# Patient Record
Sex: Female | Born: 1940
Health system: Southern US, Community
[De-identification: ages and names within clinical notes are randomized; demographics above are authoritative.]

## PROBLEM LIST (undated history)

## (undated) DIAGNOSIS — E785 Hyperlipidemia, unspecified: Secondary | ICD-10-CM

## (undated) DIAGNOSIS — I10 Essential (primary) hypertension: Secondary | ICD-10-CM

## (undated) DIAGNOSIS — E079 Disorder of thyroid, unspecified: Secondary | ICD-10-CM

## (undated) HISTORY — PX: PACEMAKER INSERTION: SHX728

## (undated) HISTORY — PX: ABDOMINAL HYSTERECTOMY: SHX81

## (undated) HISTORY — DX: Disorder of thyroid, unspecified: E07.9

## (undated) HISTORY — DX: Hyperlipidemia, unspecified: E78.5

## (undated) HISTORY — DX: Essential (primary) hypertension: I10

## (undated) HISTORY — PX: TUBAL LIGATION: SHX77

---

## 2005-06-16 ENCOUNTER — Ambulatory Visit: Payer: Self-pay | Admitting: Family Medicine

## 2005-07-19 ENCOUNTER — Ambulatory Visit: Payer: Self-pay | Admitting: Family Medicine

## 2005-11-16 ENCOUNTER — Ambulatory Visit: Payer: Self-pay | Admitting: Family Medicine

## 2006-02-17 ENCOUNTER — Ambulatory Visit: Payer: Self-pay | Admitting: Family Medicine

## 2006-04-14 ENCOUNTER — Ambulatory Visit: Payer: Self-pay | Admitting: Family Medicine

## 2006-04-19 DIAGNOSIS — E7800 Pure hypercholesterolemia, unspecified: Secondary | ICD-10-CM

## 2006-04-19 DIAGNOSIS — I10 Essential (primary) hypertension: Secondary | ICD-10-CM

## 2006-06-10 ENCOUNTER — Ambulatory Visit: Payer: Self-pay | Admitting: Family Medicine

## 2006-09-02 ENCOUNTER — Encounter: Payer: Self-pay | Admitting: Family Medicine

## 2006-09-02 ENCOUNTER — Ambulatory Visit: Payer: Self-pay | Admitting: Family Medicine

## 2006-09-03 ENCOUNTER — Encounter: Payer: Self-pay | Admitting: Family Medicine

## 2006-09-05 LAB — CONVERTED CEMR LAB
Trich, Wet Prep: NONE SEEN
Yeast Wet Prep HPF POC: NONE SEEN

## 2006-09-16 ENCOUNTER — Telehealth: Payer: Self-pay | Admitting: Family Medicine

## 2006-12-30 ENCOUNTER — Ambulatory Visit: Payer: Self-pay | Admitting: Family Medicine

## 2007-01-09 ENCOUNTER — Encounter: Payer: Self-pay | Admitting: Family Medicine

## 2007-01-10 ENCOUNTER — Encounter: Payer: Self-pay | Admitting: Family Medicine

## 2007-01-10 LAB — CONVERTED CEMR LAB
ALT: 24 units/L (ref 0–35)
AST: 20 units/L (ref 0–37)
Calcium: 9.3 mg/dL (ref 8.4–10.5)
Chloride: 105 meq/L (ref 96–112)
Cholesterol, target level: 200 mg/dL
Cholesterol: 209 mg/dL — ABNORMAL HIGH (ref 0–200)
HDL: 41 mg/dL (ref >39)
LDL Cholesterol: 126 mg/dL — ABNORMAL HIGH (ref 0–99)
Total Protein: 7 g/dL (ref 6.0–8.3)
Triglycerides: 209 mg/dL — ABNORMAL HIGH (ref <150)
VLDL: 42 mg/dL — ABNORMAL HIGH (ref 0–40)

## 2007-06-20 ENCOUNTER — Ambulatory Visit: Payer: Self-pay | Admitting: Family Medicine

## 2007-06-21 ENCOUNTER — Encounter: Payer: Self-pay | Admitting: Family Medicine

## 2007-10-17 ENCOUNTER — Encounter: Payer: Self-pay | Admitting: Family Medicine

## 2007-10-17 LAB — CONVERTED CEMR LAB
ALT: 18 units/L (ref 0–35)
AST: 19 units/L (ref 0–37)
Albumin: 4.5 g/dL (ref 3.5–5.2)
Calcium: 9.4 mg/dL (ref 8.4–10.5)
Cholesterol: 181 mg/dL (ref 0–200)
Total Bilirubin: 0.6 mg/dL (ref 0.3–1.2)
Total CHOL/HDL Ratio: 4.9
Total Protein: 7 g/dL (ref 6.0–8.3)

## 2007-10-18 ENCOUNTER — Ambulatory Visit: Payer: Self-pay | Admitting: Family Medicine

## 2007-10-18 DIAGNOSIS — E1165 Type 2 diabetes mellitus with hyperglycemia: Secondary | ICD-10-CM | POA: Insufficient documentation

## 2007-10-18 DIAGNOSIS — IMO0002 Reserved for concepts with insufficient information to code with codable children: Secondary | ICD-10-CM | POA: Insufficient documentation

## 2008-02-28 ENCOUNTER — Telehealth: Payer: Self-pay | Admitting: Family Medicine

## 2008-02-29 ENCOUNTER — Ambulatory Visit: Payer: Self-pay | Admitting: Family Medicine

## 2008-03-14 ENCOUNTER — Telehealth (INDEPENDENT_AMBULATORY_CARE_PROVIDER_SITE_OTHER): Payer: Self-pay | Admitting: *Deleted

## 2008-04-26 ENCOUNTER — Ambulatory Visit: Payer: Self-pay | Admitting: Family Medicine

## 2008-05-07 ENCOUNTER — Telehealth: Payer: Self-pay | Admitting: Family Medicine

## 2008-11-01 ENCOUNTER — Ambulatory Visit: Payer: Self-pay | Admitting: Family Medicine

## 2008-11-04 LAB — CONVERTED CEMR LAB
AST: 24 units/L (ref 0–37)
Alkaline Phosphatase: 67 units/L (ref 39–117)
BUN: 29 mg/dL — ABNORMAL HIGH (ref 6–23)
Chloride: 104 meq/L (ref 96–112)
Cholesterol: 196 mg/dL (ref 0–200)
Creatinine, Ser: 1.13 mg/dL (ref 0.40–1.20)
Glucose, Bld: 113 mg/dL — ABNORMAL HIGH (ref 70–99)
Potassium: 4.5 meq/L (ref 3.5–5.3)
TSH: 9.165 microintl units/mL — ABNORMAL HIGH (ref 0.350–4.500)
Total CHOL/HDL Ratio: 5.6
VLDL: 58 mg/dL — ABNORMAL HIGH (ref 0–40)

## 2008-11-05 ENCOUNTER — Encounter: Payer: Self-pay | Admitting: Family Medicine

## 2008-11-05 LAB — CONVERTED CEMR LAB
Free T4: 0.77 ng/dL — ABNORMAL LOW (ref 0.80–1.80)
T3, Free: 3.2 pg/mL (ref 2.3–4.2)

## 2008-11-07 ENCOUNTER — Telehealth (INDEPENDENT_AMBULATORY_CARE_PROVIDER_SITE_OTHER): Payer: Self-pay | Admitting: *Deleted

## 2008-11-14 ENCOUNTER — Telehealth: Payer: Self-pay | Admitting: Family Medicine

## 2009-01-06 ENCOUNTER — Telehealth: Payer: Self-pay | Admitting: Family Medicine

## 2009-01-08 ENCOUNTER — Encounter: Payer: Self-pay | Admitting: Family Medicine

## 2009-01-09 LAB — CONVERTED CEMR LAB: TSH: 4.327 microintl units/mL (ref 0.350–4.500)

## 2009-01-30 ENCOUNTER — Ambulatory Visit: Payer: Self-pay | Admitting: Family Medicine

## 2009-01-30 DIAGNOSIS — E039 Hypothyroidism, unspecified: Secondary | ICD-10-CM

## 2009-02-10 ENCOUNTER — Encounter: Payer: Self-pay | Admitting: Family Medicine

## 2009-02-11 LAB — CONVERTED CEMR LAB
ALT: 49 units/L — ABNORMAL HIGH (ref 0–35)
Albumin: 4.4 g/dL (ref 3.5–5.2)
Alkaline Phosphatase: 59 units/L (ref 39–117)
BUN: 13 mg/dL (ref 6–23)
Chloride: 106 meq/L (ref 96–112)
Cholesterol: 175 mg/dL (ref 0–200)
Glucose, Bld: 116 mg/dL — ABNORMAL HIGH (ref 70–99)
Sodium: 144 meq/L (ref 135–145)
T3, Free: 3.2 pg/mL (ref 2.3–4.2)
Total CHOL/HDL Ratio: 5.3
Triglycerides: 206 mg/dL — ABNORMAL HIGH (ref <150)
VLDL: 41 mg/dL — ABNORMAL HIGH (ref 0–40)

## 2009-04-14 ENCOUNTER — Telehealth: Payer: Self-pay | Admitting: Family Medicine

## 2009-04-15 ENCOUNTER — Encounter: Payer: Self-pay | Admitting: Family Medicine

## 2009-04-29 ENCOUNTER — Telehealth: Payer: Self-pay | Admitting: Family Medicine

## 2009-05-06 ENCOUNTER — Encounter: Admission: RE | Admit: 2009-05-06 | Discharge: 2009-05-06 | Payer: Self-pay | Admitting: Family Medicine

## 2009-08-05 ENCOUNTER — Telehealth: Payer: Self-pay | Admitting: Family Medicine

## 2010-01-06 ENCOUNTER — Ambulatory Visit: Payer: Self-pay | Admitting: Family

## 2010-01-07 ENCOUNTER — Encounter: Payer: Self-pay | Admitting: Family

## 2010-01-07 LAB — CONVERTED CEMR LAB
ALT: 40 units/L — ABNORMAL HIGH (ref 0–35)
AST: 35 units/L (ref 0–37)
Alkaline Phosphatase: 58 units/L (ref 39–117)
BUN: 18 mg/dL (ref 6–23)
CO2: 25 meq/L (ref 19–32)
Chloride: 101 meq/L (ref 96–112)
Creatinine, Ser: 1.08 mg/dL (ref 0.40–1.20)
Glucose, Bld: 96 mg/dL (ref 70–99)
Hgb A1c MFr Bld: 6.4 % — ABNORMAL HIGH (ref <5.7)
Sodium: 140 meq/L (ref 135–145)

## 2010-01-08 ENCOUNTER — Telehealth: Payer: Self-pay | Admitting: Family

## 2010-01-16 ENCOUNTER — Encounter: Payer: Self-pay | Admitting: Family

## 2010-01-16 LAB — CONVERTED CEMR LAB
LDL Cholesterol: 133 mg/dL — ABNORMAL HIGH (ref 0–99)
Triglycerides: 241 mg/dL — ABNORMAL HIGH (ref <150)
VLDL: 48 mg/dL — ABNORMAL HIGH (ref 0–40)

## 2010-01-22 ENCOUNTER — Telehealth: Payer: Self-pay | Admitting: Family

## 2010-01-22 ENCOUNTER — Encounter: Payer: Self-pay | Admitting: Family

## 2010-02-03 ENCOUNTER — Encounter: Payer: Self-pay | Admitting: Family Medicine

## 2010-03-27 ENCOUNTER — Ambulatory Visit: Payer: Self-pay | Admitting: Family Medicine

## 2010-03-30 LAB — CONVERTED CEMR LAB
Alkaline Phosphatase: 52 units/L (ref 39–117)
BUN: 19 mg/dL (ref 6–23)
CO2: 25 meq/L (ref 19–32)
Calcium: 9.1 mg/dL (ref 8.4–10.5)
Chloride: 106 meq/L (ref 96–112)
Creatinine, Ser: 1.14 mg/dL (ref 0.40–1.20)
Glucose, Bld: 114 mg/dL — ABNORMAL HIGH (ref 70–99)
HDL: 40 mg/dL (ref >39)
Potassium: 4.4 meq/L (ref 3.5–5.3)
TSH: 0.212 microintl units/mL — ABNORMAL LOW (ref 0.350–4.500)
Total Bilirubin: 0.6 mg/dL (ref 0.3–1.2)
Total Protein: 7.1 g/dL (ref 6.0–8.3)
Triglycerides: 235 mg/dL — ABNORMAL HIGH (ref <150)
VLDL: 47 mg/dL — ABNORMAL HIGH (ref 0–40)

## 2010-04-02 ENCOUNTER — Telehealth: Payer: Self-pay | Admitting: Family Medicine

## 2010-05-06 ENCOUNTER — Encounter: Payer: Self-pay | Admitting: Family Medicine

## 2010-05-06 LAB — CONVERTED CEMR LAB
OCCULT 1: NEGATIVE
OCCULT 3: NEGATIVE

## 2010-07-10 ENCOUNTER — Encounter
Admission: RE | Admit: 2010-07-10 | Discharge: 2010-07-10 | Payer: Self-pay | Source: Home / Self Care | Attending: Family Medicine | Admitting: Family Medicine

## 2010-07-10 ENCOUNTER — Ambulatory Visit
Admission: RE | Admit: 2010-07-10 | Discharge: 2010-07-10 | Payer: Self-pay | Source: Home / Self Care | Attending: Family Medicine | Admitting: Family Medicine

## 2010-07-10 DIAGNOSIS — M7700 Medial epicondylitis, unspecified elbow: Secondary | ICD-10-CM | POA: Insufficient documentation

## 2010-07-14 ENCOUNTER — Encounter
Admission: RE | Admit: 2010-07-14 | Discharge: 2010-07-14 | Payer: Self-pay | Source: Home / Self Care | Attending: Family Medicine | Admitting: Family Medicine

## 2010-08-04 ENCOUNTER — Encounter: Payer: Self-pay | Admitting: Family Medicine

## 2010-08-11 NOTE — Letter (Signed)
   [place] at [facility] [address] [place], [place]  [address]  [place] Phone: [phone]      January 22, 2010   [facility] [address] [place], [place] [address]  RE:  LAB RESULTS  Dear  Ms. Glander,  The following is an interpretation of your most recent lab tests.  Please take note of any instructions provided or changes to medications that have resulted from your lab work.  LIPID PANEL:  Fair - review at your next visit Triglyceride: 241   Cholesterol: 219   LDL: 133   HDL: 38   Chol/HDL%:  5.8 Ratio   Please continue pravastatin.  Our office will call you to arrange follow up blood work in 1 month.  Please arrange an appointment with Dr. Linford Arnold in 3 months.   Sincerely Yours,    Lemont Fillers FNP

## 2010-08-11 NOTE — Progress Notes (Signed)
Summary: CHolesterol med  Phone Note Call from Patient Call back at Home Phone [phone]   Caller: Patient Call For: Brandi Gasser MD Summary of Call: Pt calls back today and says since she can not afford the Lipitor is there anything else she can take that would be cheaper for her so she can do some price checking on them Initial call taken by: Kathlene November,  April 02, 2010 11:14 AM  Follow-up for Phone Call        Other option would be crestor which is branded but could do a higher dose adn split in half. Bottle will says once tab a day but really split in half.   Can send to pharm and have them price it out. If not affordable then can ask the pharmacist if any cheaper options on your insurance plan.  Follow-up by: Brandi Gasser MD,  April 02, 2010 12:44 PM  Additional Follow-up for Phone Call Additional follow up Details #1::        Pt notified of above instructions. kJ LPN Additional Follow-up by: Kathlene November,  April 02, 2010 2:19 PM    New/Updated Medications: CRESTOR 20 MG TABS (ROSUVASTATIN CALCIUM) Take 1 tablet by mouth once a day Prescriptions: CRESTOR 20 MG TABS (ROSUVASTATIN CALCIUM) Take 1 tablet by mouth once a day  #30 x 1   Entered and Authorized by:   Brandi Gasser MD   Signed by:   Brandi Gasser MD on 04/02/2010   Method used:   Electronically to        [facility] [phone]* (retail)       [address] [place], [place]  [address]       Ph: [phone]       Fax: [phone]   RxID:   [id number]

## 2010-08-11 NOTE — Progress Notes (Signed)
Summary: questions about meds- Lovaza too expensive  Phone Note Call from Patient   Caller: Patient Summary of Call: Dr.Metheney    Call Back [phone]---Dont leave a meaage bo she dont check voice mail   Patient wants a call back, questions about meds. Initial call taken by: Vanessa [place],  August 05, 2009 11:28 AM  Follow-up for Phone Call        Lovaza too expensive. Will take OTC fish oil 8 a day instead. Just an FYI Follow-up by: Kathlene November,  August 05, 2009 11:34 AM    New/Updated Medications: FISH OIL 1000 MG  CAPS (OMEGA-3 FATTY ACIDS) 8  tabs by mouth two times a day

## 2010-08-11 NOTE — Progress Notes (Signed)
  Phone Note Outgoing Call   Summary of Call: Please call patient and arrange a follow up lab draw in 1 month.  Cholesterol not yet at goal, but we will see how she does with the changed to pravastatin. I have entered orders.  Thanks  Follow-up for Phone Call        Pt notified of results and instructions. Follow-up by: Kathlene November,  January 22, 2010 12:26 PM

## 2010-08-11 NOTE — Progress Notes (Signed)
  Phone Note Outgoing Call   Call placed by: Lemont Fillers FNP,  January 08, 2010 9:02 AM Call placed to: Patient Summary of Call: Called patient, reviewed labs.  TSH is up.  Pt was only taking of levothyroxine for last several weeks-  instructed patient to resume and arrange f/u appointment with Dr. Linford Arnold in 6 weeks.  At that time she will need f/u TSH and LFT's (consider abdominal ultrasound at that time if ALT is still elevated). Initial call taken by: Lemont Fillers FNP,  January 08, 2010 9:05 AM

## 2010-08-11 NOTE — Assessment & Plan Note (Signed)
Summary: F/U chol,  thyroid   Vital Signs:  Patient profile:   70 year old female Height:      70 inches Weight:      230 pounds Pulse rate:   74 / minute BP sitting:   151 / 78  (left arm) Cuff size:   large  Vitals Entered By: Kathlene November (March 27, 2010 8:59 AM) Flu Vaccine Consent Questions     Do you have a history of severe allergic reactions to this vaccine? no    Any prior history of allergic reactions to egg and/or gelatin? no    Do you have a sensitivity to the preservative Thimersol? no    Do you have a past history of Guillan-Barre Syndrome? no    Do you currently have an acute febrile illness? no    Have you ever had a severe reaction to latex? no    Vaccine information given and explained to patient? yes    Are you currently pregnant? no    Lot Number:[id number]   Exp Date:01/09/2011   Site Given  Left Deltoid IM  Serial Vital Signs/Assessments:  Time      Position  BP       Pulse  Resp  Temp     By 9:25 AM             137/78                         Kathlene November  CC: needs cholesterol and TSH rechecked and refills, Hypertension Management   Primary Care Provider:  Nani Gasser MD  CC:  needs cholesterol and TSH rechecked and refills and Hypertension Management.  History of Present Illness: needs cholesterol and TSH rechecked and refills. Didn't  take BP Pills yet as she says she is fasting for labwork.    Hypertension History:      She denies headache, chest pain, palpitations, dyspnea with exertion, orthopnea, PND, peripheral edema, visual symptoms, neurologic problems, syncope, and side effects from treatment.  She notes no problems with any antihypertensive medication side effects.        Positive major cardiovascular risk factors include female age 25 years old or older, hyperlipidemia, and hypertension.  Negative major cardiovascular risk factors include no history of diabetes, negative family history for ischemic heart disease, and  non-tobacco-user status.        Further assessment for target organ damage reveals no history of ASHD, stroke/TIA, or peripheral vascular disease.     Current Medications (verified): 1)  Hydrochlorothiazide 25 Mg Tabs (Hydrochlorothiazide) .... Take 1 Tablet By Mouth Once A Day 2)  Metoprolol Tartrate 50 Mg Tabs (Metoprolol Tartrate) .Marland Kitchen.. 1 1/2 By Mouth Twice A Day 3)  Pravastatin Sodium 80 Mg Tabs (Pravastatin Sodium) .... One Tab By Mouth At Bedtime 4)  Fish Oil 1000 Mg  Caps (Omega-3 Fatty Acids) .... Take 3 Capsules By Mouth Twice A Day 5)  Vitamin C .... By Mouth Daily 6)  L Lysine .... By Mouth Daily 7)  Mv1 .... By Mouth Daily 8)  Calcium .... By Mouth Daily 9)  Asa 81mg  .... 1 By Mouth Daily 10)  Levothroid 75 Mcg Tabs (Levothyroxine Sodium) .... Take 1 Tablet By Mouth Once A Day  Allergies (verified): No Known Drug Allergies  Comments:  Nurse/Medical Assistant: The patient's medications and allergies were reviewed with the patient and were updated in the Medication and Allergy Lists. Kathlene November (March 27, 2010 9:00  AM)  Physical Exam  General:  Well-developed,well-nourished,in no acute distress; alert,appropriate and cooperative throughout examination Head:  Normocephalic and atraumatic without obvious abnormalities. No apparent alopecia or balding. Eyes:  No corneal or conjunctival inflammation noted. EOMI. Perrla. Neck:  No deformities, masses, or tenderness noted. NO TM.  Lungs:  Normal respiratory effort, chest expands symmetrically. Lungs are clear to auscultation, no crackles or wheezes. Heart:  Normal rate and regular rhythm. S1 and S2 normal without gallop, murmur, click, rub or other extra sounds. No carotid bruits.  Skin:  no rashes.   Cervical Nodes:  No lymphadenopathy noted Psych:  Cognition and judgment appear intact. Alert and cooperative with normal attention span and concentration. No apparent delusions, illusions, hallucinations   Impression &  Recommendations:  Problem # 1:  HYPERCHOLESTEROLEMIA (ICD-272.0) Recheck since changed to pravastatin. If not at goal change to Lipitor when goes generic.  Given flu and pneumoniia vaccines and stool cards today.  Her updated medication list for this problem includes:    Pravastatin Sodium 80 Mg Tabs (Pravastatin sodium) ..... One tab by mouth at bedtime  Orders: T-Lipid Profile [phone]) T-Comprehensive Metabolic Panel [phone])  Labs Reviewed: SGOT: 35 (01/07/2010)   SGPT: 40 (01/07/2010)  Lipid Goals: Chol Goal: 200 (01/10/2007)   HDL Goal: 40 (01/10/2007)   LDL Goal: 130 (01/10/2007)   TG Goal: 150 (01/10/2007)  Prior 10 Yr Risk Heart Disease: 20 % (01/06/2010)   HDL:38 (01/16/2010), 33 (02/10/2009)  LDL:133 (01/16/2010), 101 (02/10/2009)  Chol:219 (01/16/2010), 175 (02/10/2009)  Trig:241 (01/16/2010), 206 (02/10/2009)  Problem # 2:  UNSPECIFIED HYPOTHYROIDISM (ICD-244.9) Due to rehceck level.  Her updated medication list for this problem includes:    Levothroid 75 Mcg Tabs (Levothyroxine sodium) .Marland Kitchen... Take 1 tablet by mouth once a day  Orders: T-TSH ([id number])  Problem # 3:  HYPERTENSION, BENIGN SYSTEMIC (ICD-401.1) Not at goal today but hasn't taken meds this AM.  Says home BPs are well controlled.  Her updated medication list for this problem includes:    Hydrochlorothiazide 25 Mg Tabs (Hydrochlorothiazide) .Marland Kitchen... Take 1 tablet by mouth once a day    Metoprolol Tartrate 50 Mg Tabs (Metoprolol tartrate) .Marland Kitchen... 1 1/2 by mouth twice a day  BP today: 151/78 Prior BP: 136/79 (01/06/2010)  Prior 10 Yr Risk Heart Disease: 20 % (01/06/2010)  Labs Reviewed: K+: 4.8 (01/07/2010) Creat: : 1.08 (01/07/2010)   Chol: 219 (01/16/2010)   HDL: 38 (01/16/2010)   LDL: 133 (01/16/2010)   TG: 241 (01/16/2010)  Complete Medication List: 1)  Hydrochlorothiazide 25 Mg Tabs (Hydrochlorothiazide) .... Take 1 tablet by mouth once a day 2)  Metoprolol Tartrate 50 Mg Tabs (Metoprolol  tartrate) .Marland Kitchen.. 1 1/2 by mouth twice a day 3)  Pravastatin Sodium 80 Mg Tabs (Pravastatin sodium) .... One tab by mouth at bedtime 4)  Fish Oil 1000 Mg Caps (Omega-3 fatty acids) .... Take 3 capsules by mouth twice a day 5)  Vitamin C  .... By mouth daily 6)  L Lysine  .... By mouth daily 7)  Mv1  .... By mouth daily 8)  Calcium  .... By mouth daily 9)  Asa 81mg   .... 1 by mouth daily 10)  Levothroid 75 Mcg Tabs (Levothyroxine sodium) .... Take 1 tablet by mouth once a day  Other Orders: Flu Vaccine 50yrs + MEDICARE PATIENTS (V7846) Administration Flu vaccine - MCR (N6295) Pneumococcal Vaccine (28413) Admin 1st Vaccine (24401)  Hypertension Assessment/Plan:      The patient's hypertensive risk group is category B: At least  one risk factor (excluding diabetes) with no target organ damage.  Her calculated 10 year risk of coronary heart disease is 17 %.  Today's blood pressure is 151/78.  Her blood pressure goal is < 140/90.  Contraindications/Deferment of Procedures/Staging:    Test/Procedure: Colonoscopy    Reason for deferment: patient declined    Patient Instructions: 1)  We will call you with your lab results.  2)  Please schedule a follow-up appointment in 6 months .     Immunizations Administered:  Pneumonia Vaccine:    Vaccine Type: Pneumovax (Medicare)    Site: right deltoid    Mfr: Merck    Dose: 0.5 ml    Route: IM    Given by: Kathlene November    Exp. Date: 09/16/2011    Lot #: [id number]    VIS given: 06/16/09 version given March 27, 2010.

## 2010-08-11 NOTE — Assessment & Plan Note (Signed)
Summary: f/u on meds - jr   Vital Signs:  Patient profile:   70 year old female Height:      70 inches Weight:      232 pounds BMI:     33.41 Pulse rate:   91 / minute BP sitting:   136 / 79  (left arm) Cuff size:   large  Vitals Entered By: Kathlene November (January 06, 2010 2:41 PM) CC: followup Bp and needs cholesterol checked, Hypertension Management, Lipid Management   Primary Care Provider:  Nani Gasser MD  CC:  followup Bp and needs cholesterol checked, Hypertension Management, and Lipid Management.  History of Present Illness: Brandi Singleton is a 70 year old female who presents today for follow up and renewal of her medications.   1)HTN- ran out of her metoprolol.    2)hypothyroid-  ran out two days ago, previously taking 25mg .   3)Overweight-  Has lost 8 pounds since last visit.    Hypertension History:      She denies headache, chest pain, palpitations, dyspnea with exertion, orthopnea, peripheral edema, visual symptoms, and syncope.        Positive major cardiovascular risk factors include female age 73 years old or older, hyperlipidemia, and hypertension.  Negative major cardiovascular risk factors include no history of diabetes, negative family history for ischemic heart disease, and non-tobacco-user status.        Further assessment for target organ damage reveals no history of ASHD, stroke/TIA, or peripheral vascular disease.    Lipid Management History:      Positive NCEP/ATP III risk factors include female age 30 years old or older, HDL cholesterol less than 40, and hypertension.  Negative NCEP/ATP III risk factors include no history of early menopause without estrogen hormone replacement, non-diabetic, no family history for ischemic heart disease, non-tobacco-user status, no ASHD (atherosclerotic heart disease), no prior stroke/TIA, no peripheral vascular disease, and no history of aortic aneurysm.      Current Medications (verified): 1)  Hydrochlorothiazide 25  Mg Tabs (Hydrochlorothiazide) .... Take 1 Tablet By Mouth Once A Day 2)  Metoprolol Tartrate 50 Mg Tabs (Metoprolol Tartrate) .Marland Kitchen.. 1 1/2 By Mouth Twice A Day 3)  Simvastatin 80 Mg Tabs (Simvastatin) .... Take 1 Tablet By Mouth Once A Day 4)  Fish Oil 1000 Mg  Caps (Omega-3 Fatty Acids) .... Take 3 Capsules By Mouth Twice A Day 5)  Vitamin C .... By Mouth Daily 6)  L Lysine .... By Mouth Daily 7)  Mv1 .... By Mouth Daily 8)  Calcium .... By Mouth Daily 9)  Asa 81mg  .... 1 By Mouth Daily 10)  Levothyroxine Sodium 50 Mcg Tabs (Levothyroxine Sodium) .Marland Kitchen.. 1 Tab By Mouth Daily, An Hr Prior To Breakfast  Allergies (verified): No Known Drug Allergies  Comments:  Nurse/Medical Assistant: The patient's medications and allergies were reviewed with the patient and were updated in the Medication and Allergy Lists. Kathlene November (January 06, 2010 2:43 PM)  Past History:  Past Medical History: Last updated: 04/19/2006 _  Past Surgical History: Last updated: 06/03/2006 Hysterectomy-Patial for fibroids,  Tubal ligation  Family History: Last updated: 04/19/2006 _  Social History: Last updated: 04/19/2006 Recently separated from her husband of 42 yrs who is battling with depression. Moved from [place] to be near her children.  Lives alone but son live within walking distance. Never smoked, no EtOH, no drugs, no caffeine.  Occ walking for exercise.  Risk Factors: Smoking Status: never (09/02/2006)  Physical Exam  General:  Well-developed,well-nourished,in no acute distress; alert,appropriate and cooperative throughout examination Lungs:  Normal respiratory effort, chest expands symmetrically. Lungs are clear to auscultation, no crackles or wheezes. Heart:  Normal rate and regular rhythm. S1 and S2 normal without gallop, murmur, click, rub or other extra sounds. Extremities:  No clubbing, cyanosis, edema, or deformity noted with normal full range of motion of all joints.     Impression  & Recommendations:  Problem # 1:  UNSPECIFIED HYPOTHYROIDISM (ICD-244.9)  Her updated medication list for this problem includes:    Levothyroxine Sodium 50 Mcg Tabs (Levothyroxine sodium) .Marland Kitchen... 1 tab by mouth daily, an hr prior to breakfast  Orders: T-TSH ([id number])  Problem # 2:  HYPERTENSION, BENIGN SYSTEMIC (ICD-401.1)  Her updated medication list for this problem includes:    Hydrochlorothiazide 25 Mg Tabs (Hydrochlorothiazide) .Marland Kitchen... Take 1 tablet by mouth once a day    Metoprolol Tartrate 50 Mg Tabs (Metoprolol tartrate) .Marland Kitchen... 1 1/2 by mouth twice a day  BP today: 136/79 Prior BP: 132/71 (01/30/2009)  Prior 10 Yr Risk Heart Disease: 13 % (01/30/2009)  Labs Reviewed: K+: 5.0 (02/10/2009) Creat: : 1.13 (02/10/2009)   Chol: 175 (02/10/2009)   HDL: 33 (02/10/2009)   LDL: 101 (02/10/2009)   TG: 206 (02/10/2009)  Orders: T-Comprehensive Metabolic Panel ([id number])  Problem # 3:  INSULIN RESISTANCE SYNDROME (ICD-259.8) Assessment: Comment Only check follow up A1C Orders: T-Hgb A1C ([id number])  Problem # 4:  HYPERCHOLESTEROLEMIA (ICD-272.0) Will switch Simvastatin to Pravastatin due to recent FDA recommendations.  Check FLP Her updated medication list for this problem includes:    Pravastatin Sodium 80 Mg Tabs (Pravastatin sodium) ..... One tab by mouth at bedtime  Orders: T-Comprehensive Metabolic Panel [phone]) T-Lipid Profile ([id number])  Complete Medication List: 1)  Hydrochlorothiazide 25 Mg Tabs (Hydrochlorothiazide) .... Take 1 tablet by mouth once a day 2)  Metoprolol Tartrate 50 Mg Tabs (Metoprolol tartrate) .Marland Kitchen.. 1 1/2 by mouth twice a day 3)  Pravastatin Sodium 80 Mg Tabs (Pravastatin sodium) .... One tab by mouth at bedtime 4)  Fish Oil 1000 Mg Caps (Omega-3 fatty acids) .... Take 3 capsules by mouth twice a day 5)  Vitamin C  .... By mouth daily 6)  L Lysine  .... By mouth daily 7)  Mv1  .... By mouth daily 8)  Calcium  .... By mouth daily 9)   Asa 81mg   .... 1 by mouth daily 10)  Levothyroxine Sodium 50 Mcg Tabs (Levothyroxine sodium) .Marland Kitchen.. 1 tab by mouth daily, an hr prior to breakfast  Hypertension Assessment/Plan:      The patient's hypertensive risk group is category B: At least one risk factor (excluding diabetes) with no target organ damage.  Her calculated 10 year risk of coronary heart disease is 20 %.  Today's blood pressure is 136/79.  Her blood pressure goal is < 140/90.  Lipid Assessment/Plan:      Based on NCEP/ATP III, the patient's risk factor category is "2 or more risk factors and a calculated 10 year CAD risk of < 20%".  The patient's lipid goals are as follows: Total cholesterol goal is 200; LDL cholesterol goal is 130; HDL cholesterol goal is 40; Triglyceride goal is 150.  Her LDL cholesterol goal has been met.  She has been counseled on adjunctive measures for lowering her cholesterol and has been provided with dietary instructions.    Patient Instructions: 1)  Please complete your lab work downstairs today. 2)  Return fasting for your cholesterol. 3)  Follow up in  3 months with Dr. Linford Arnold. Prescriptions: PRAVASTATIN SODIUM 80 MG TABS (PRAVASTATIN SODIUM) one tab by mouth at bedtime  #90 x 0   Entered and Authorized by:   Lemont Fillers FNP   Signed by:   Lemont Fillers FNP on 01/06/2010   Method used:   Electronically to        [facility] [phone]* (retail)       [address] [place], [place]  [address]       Ph: [phone]       Fax: [phone]   RxID:   [id number] LEVOTHYROXINE SODIUM 50 MCG TABS (LEVOTHYROXINE SODIUM) 1 tab by mouth daily, an hr prior to breakfast  #30 Each x 2   Entered and Authorized by:   Lemont Fillers FNP   Signed by:   Lemont Fillers FNP on 01/06/2010   Method used:   Electronically to        [facility] [phone]* (retail)       [address] [place], [place]  [address]       Ph: [phone]       Fax: [phone]   RxID:    [id number] METOPROLOL TARTRATE 50 MG TABS (METOPROLOL TARTRATE) 1 1/2 by mouth twice a day  #270 x 0   Entered and Authorized by:   Lemont Fillers FNP   Signed by:   Lemont Fillers FNP on 01/06/2010   Method used:   Electronically to        [facility] [phone]* (retail)       [address] [place], [place]  [address]       Ph: [phone]       Fax: [phone]   RxID:   [id number] HYDROCHLOROTHIAZIDE 25 MG TABS (HYDROCHLOROTHIAZIDE) Take 1 tablet by mouth once a day  #90 Each x 0   Entered and Authorized by:   Lemont Fillers FNP   Signed by:   Lemont Fillers FNP on 01/06/2010   Method used:   Electronically to        [facility] [phone]* (retail)       [address] [place], [place]  [address]       Ph: [phone]       Fax: [phone]   RxID:   [id number]

## 2010-08-11 NOTE — Miscellaneous (Signed)
Summary: Stool cards  Clinical Lists Changes  Problems: Added new problem of SCREENING, COLON CANCER (ICD-V76.51) Orders: Added new Service order of Hemoccult Cards -3 specimans (take home) ([address]) - Signed Observations: Added new observation of HEMOCCULT 3: negative (05/06/2010 15:04) Added new observation of HEMOCCULT 2: negative (05/06/2010 15:04) Added new observation of HEMOCCULT 1: negative (05/06/2010 15:04)     Laboratory Results  Date/Time Received: 05/05/10 Date/Time Reported: 05/05/10  Stool - Occult Blood Hemmoccult #1: negative Hemoccult #2: negative Hemoccult #3: negative  McCrimmon CMA, Duncan Dull), Andrea 3:07 PM May 06, 2010 pt notified

## 2010-08-13 NOTE — Assessment & Plan Note (Signed)
Summary: Medial epicondylitis   Vital Signs:  Patient profile:   70 year old female Height:      70 inches Weight:      234 pounds Pulse rate:   70 / minute BP sitting:   138 / 76  (right arm) Cuff size:   large  Vitals Entered By: Avon Gully CMA, Duncan Dull) (July 10, 2010 9:36 AM) CC: rt elbow pain since oct   Primary Care Provider:  Nani Gasser MD  CC:  rt elbow pain since oct.  History of Present Illness: Reaching above her stove and hit her elbow on the hood over her stove. Says it hurt at the time but wasn't severe. . Now painful to lift objecta nd occ gets a  burning sensation.  Initial injury in October.  Nontender to touch.  No swelling or bruising.  No meds for discomfort.  Says really only bothers her when tries to lift somethign like a jug of milk. Rest helps her sxs.   Current Medications (verified): 1)  Hydrochlorothiazide 25 Mg Tabs (Hydrochlorothiazide) .... Take 1 Tablet By Mouth Once A Day 2)  Metoprolol Tartrate 50 Mg Tabs (Metoprolol Tartrate) .Marland Kitchen.. 1 1/2 By Mouth Twice A Day 3)  Fish Oil 1000 Mg  Caps (Omega-3 Fatty Acids) .... Take 3 Capsules By Mouth Twice A Day 4)  Vitamin C .... By Mouth Daily 5)  L Lysine .... By Mouth Daily 6)  Mv1 .... By Mouth Daily 7)  Calcium .... By Mouth Daily 8)  Asa 81mg  .... 1 By Mouth Daily 9)  Levothroid 75 Mcg Tabs (Levothyroxine Sodium) .... Take 1 Tablet By Mouth Once A Day Except Monday and Thursday. 10)  Levothroid 50 Mcg Tabs (Levothyroxine Sodium) .... Take 1 Tablet By Mouth Once A Day On Mondays and Thursdays. 11)  Crestor 20 Mg Tabs (Rosuvastatin Calcium) .... Take 1 Tablet By Mouth Once A Day  Allergies (verified): No Known Drug Allergies  Comments:  Nurse/Medical Assistant: The patient's medications and allergies were reviewed with the patient and were updated in the Medication and Allergy Lists. Avon Gully CMA, Duncan Dull) (July 10, 2010 9:37 AM)  Physical Exam  General:   Well-developed,well-nourished,in no acute distress; alert,appropriate and cooperative throughout examination Msk:  Righ arm witth NROM.  Soulder, elbow, and wrist sterngth 5/5.  Pain with pronation against resistance. Finger strentgh 5/5. She is tender over the medial epicondyle.    Impression & Recommendations:  Problem # 1:  MEDIAL EPICONDYLITIS (ICD-726.31)  Discussed dx.  Recd NSAID, icing and exercises to rehab the area. Jill Alexanders is avoiding heavy lifting with that arm.  She can start with the stretches for the first two weeks and advance strengthening exercises.  Will get an x-ray to rule out any chip or trauma of the bone said she did have initial injury that started the pain. F/u in 3 weeks.    Orders: T-*Unlisted Diagnostic X-ray test/procedure (30865)  Problem # 2:  UNSPECIFIED HYPOTHYROIDISM (ICD-244.9) she is due to recheck her thyroid level this month.  Lab slip given. Her updated medication list for this problem includes:    Levothroid 75 Mcg Tabs (Levothyroxine sodium) .Marland Kitchen... Take 1 tablet by mouth once a day except monday and thursday.    Levothroid 50 Mcg Tabs (Levothyroxine sodium) .Marland Kitchen... Take 1 tablet by mouth once a day on mondays and thursdays.  Orders: T-TSH ([id number])  Complete Medication List: 1)  Hydrochlorothiazide 25 Mg Tabs (Hydrochlorothiazide) .... Take 1 tablet by mouth once a day 2)  Metoprolol Tartrate 50 Mg Tabs (Metoprolol tartrate) .Marland Kitchen.. 1 1/2 by mouth twice a day 3)  Fish Oil 1000 Mg Caps (Omega-3 fatty acids) .... Take 3 capsules by mouth twice a day 4)  Vitamin C  .... By mouth daily 5)  L Lysine  .... By mouth daily 6)  Mv1  .... By mouth daily 7)  Calcium  .... By mouth daily 8)  Asa 81mg   .... 1 by mouth daily 9)  Levothroid 75 Mcg Tabs (Levothyroxine sodium) .... Take 1 tablet by mouth once a day except monday and thursday. 10)  Levothroid 50 Mcg Tabs (Levothyroxine sodium) .... Take 1 tablet by mouth once a day on mondays and thursdays. 11)   Crestor 20 Mg Tabs (Rosuvastatin calcium) .... Take 1 tablet by mouth once a day  Patient Instructions: 1)  Aleve two times a day or Ibuprofen 600mg  three times a day with food and water for 7-10 days.  2)  STart the exercises given 3)  Follow up in 3 weeks to make sure progressing.    Orders Added: 1)  T-TSH [[id number]] 2)  T-*Unlisted Diagnostic X-ray test/procedure [62130] 3)  Est. Patient Level IV [86578]

## 2010-09-28 ENCOUNTER — Other Ambulatory Visit: Payer: Self-pay | Admitting: Family Medicine

## 2010-09-28 ENCOUNTER — Encounter: Payer: Self-pay | Admitting: Family Medicine

## 2010-09-28 ENCOUNTER — Ambulatory Visit (INDEPENDENT_AMBULATORY_CARE_PROVIDER_SITE_OTHER): Payer: PRIVATE HEALTH INSURANCE | Admitting: Family Medicine

## 2010-09-28 DIAGNOSIS — D239 Other benign neoplasm of skin, unspecified: Secondary | ICD-10-CM

## 2010-10-01 ENCOUNTER — Other Ambulatory Visit: Payer: Self-pay | Admitting: Family Medicine

## 2010-10-02 NOTE — Progress Notes (Signed)
Pt.notified

## 2010-10-08 NOTE — Assessment & Plan Note (Signed)
Summary: bx skin lesion   Vital Signs:  Patient profile:   70 year old female Height:      70 inches Weight:      235 pounds Pulse rate:   73 / minute BP sitting:   132 / 79  (right arm) Cuff size:   large  Vitals Entered By: Avon Gully CMA, Duncan Dull) (September 28, 2010 2:40 PM) CC: irritated lesion left inframmamory   Primary Care Provider:  Nani Gasser MD  CC:  irritated lesion left inframmamory.  History of Present Illness: Noticed irritated skin lesion about 3 weeks ago. Says started putting some mupirocin ointment on it and that seemed to help but then last night looked really red and felt tender.  No change in size.   Current Medications (verified): 1)  Hydrochlorothiazide 25 Mg Tabs (Hydrochlorothiazide) .... Take 1 Tablet By Mouth Once A Day 2)  Metoprolol Tartrate 50 Mg Tabs (Metoprolol Tartrate) .Marland Kitchen.. 1 1/2 By Mouth Twice A Day 3)  Fish Oil 1000 Mg  Caps (Omega-3 Fatty Acids) .... Take 3 Capsules By Mouth Twice A Day 4)  Vitamin C .... By Mouth Daily 5)  L Lysine .... By Mouth Daily 6)  Mv1 .... By Mouth Daily 7)  Calcium .... By Mouth Daily 8)  Asa 81mg  .... 1 By Mouth Daily 9)  Levothroid 75 Mcg Tabs (Levothyroxine Sodium) .... Take 1 Tablet By Mouth Once A Day On Tues, Thurs, Sat, Sun. 10)  Levothroid 50 Mcg Tabs (Levothyroxine Sodium) .... Take 1 Tablet By Mouth Once A Day On Mondays and Wednesday and Fridays. 11)  Crestor 20 Mg Tabs (Rosuvastatin Calcium) .... Take 1 Tablet By Mouth Once A Day  Allergies (verified): No Known Drug Allergies  Comments:  Nurse/Medical Assistant: The patient's medications and allergies were reviewed with the patient and were updated in the Medication and Allergy Lists. Avon Gully CMA, Duncan Dull) (September 28, 2010 2:41 PM)  Physical Exam  General:  Well-developed,well-nourished,in no acute distress; alert,appropriate and cooperative throughout examination Skin:  Right bottom of breat in the 7 oclock position has a  wartly like flat lesion that is erythematous with surroundinerythema.  No drainage or blood.    Impression & Recommendations:  Problem # 1:  NEVUS, ATYPICAL (ICD-216.9)  Shave bx performed. Pt tolerated well. Will f/u path results.  Would care reviewed.   Orders: Shave Skin Lesion 0.6-1.0 cm/trunk/arm/leg (11301)  Complete Medication List: 1)  Hydrochlorothiazide 25 Mg Tabs (Hydrochlorothiazide) .... Take 1 tablet by mouth once a day 2)  Metoprolol Tartrate 50 Mg Tabs (Metoprolol tartrate) .Marland Kitchen.. 1 1/2 by mouth twice a day 3)  Fish Oil 1000 Mg Caps (Omega-3 fatty acids) .... Take 3 capsules by mouth twice a day 4)  Vitamin C  .... By mouth daily 5)  L Lysine  .... By mouth daily 6)  Mv1  .... By mouth daily 7)  Calcium  .... By mouth daily 8)  Asa 81mg   .... 1 by mouth daily 9)  Levothroid 75 Mcg Tabs (Levothyroxine sodium) .... Take 1 tablet by mouth once a day on tues, thurs, sat, sun. 10)  Levothroid 50 Mcg Tabs (Levothyroxine sodium) .... Take 1 tablet by mouth once a day on mondays and wednesday and fridays. 11)  Crestor 20 Mg Tabs (Rosuvastatin calcium) .... Take 1 tablet by mouth once a day  Patient Instructions: 1)  Wash with regular soap and water 2)  No peroxide or alcohol on the wound 3)  Apply vaseline nightly and cover with  bandaid until well healed.    Orders Added: 1)  Shave Skin Lesion 0.6-1.0 cm/trunk/arm/leg [11301]    Procedure Note Last Tetanus: given (07/12/2004)  Mole Biopsy/Removal: Indication: inflamed lesion  Procedure # 1: shave biopsy    Size (in cm): 1.0  x 1.0    Region: Left breat    Instrument used: Dbouble blade.     Anesthesia: 1% lidocaine w/epinephrine  Cleaned and prepped with: alcohol and betadine Wound dressing: bacitracin and bulky gauze dressing Instructions: daily dressing changes

## 2010-10-30 ENCOUNTER — Other Ambulatory Visit: Payer: Self-pay | Admitting: Family Medicine

## 2010-11-26 ENCOUNTER — Other Ambulatory Visit: Payer: Self-pay | Admitting: Family Medicine

## 2011-02-12 ENCOUNTER — Telehealth: Payer: Self-pay | Admitting: Family Medicine

## 2011-02-12 NOTE — Telephone Encounter (Signed)
Call pt: Tell her bx showed inflamed seborrheic keratosis. Benign.  Tell her sorry we didn't call sooner but we changed computer systems that month and evidenly ti didn't carry over.

## 2011-02-15 NOTE — Telephone Encounter (Signed)
Pt.notified

## 2011-03-11 ENCOUNTER — Other Ambulatory Visit: Payer: Self-pay | Admitting: Family Medicine

## 2011-03-16 ENCOUNTER — Other Ambulatory Visit: Payer: Self-pay | Admitting: *Deleted

## 2011-03-16 MED ORDER — LEVOTHYROXINE SODIUM 75 MCG PO TABS: 75.0000 ug | ORAL_TABLET | Freq: Every day | ORAL | Status: DC

## 2011-03-29 ENCOUNTER — Other Ambulatory Visit: Payer: Self-pay | Admitting: Family Medicine

## 2011-04-16 ENCOUNTER — Other Ambulatory Visit: Payer: Self-pay | Admitting: Family Medicine

## 2011-04-27 ENCOUNTER — Other Ambulatory Visit: Payer: Self-pay | Admitting: Family Medicine

## 2011-06-28 ENCOUNTER — Other Ambulatory Visit: Payer: Self-pay | Admitting: Family Medicine

## 2011-08-26 ENCOUNTER — Other Ambulatory Visit: Payer: Self-pay | Admitting: Family Medicine

## 2011-08-30 ENCOUNTER — Other Ambulatory Visit: Payer: Self-pay | Admitting: Family Medicine

## 2011-08-30 NOTE — Telephone Encounter (Signed)
Needs appointment

## 2011-10-11 ENCOUNTER — Other Ambulatory Visit: Payer: Self-pay | Admitting: *Deleted

## 2011-10-11 MED ORDER — METOPROLOL TARTRATE 50 MG PO TABS: 50.0000 mg | ORAL_TABLET | Freq: Two times a day (BID) | ORAL | Status: DC

## 2011-10-29 ENCOUNTER — Encounter: Payer: Self-pay | Admitting: *Deleted

## 2011-11-02 ENCOUNTER — Encounter: Payer: Self-pay | Admitting: Family Medicine

## 2011-11-02 ENCOUNTER — Ambulatory Visit (INDEPENDENT_AMBULATORY_CARE_PROVIDER_SITE_OTHER): Payer: Medicare Other | Admitting: Family Medicine

## 2011-11-02 VITALS — BP 149/92 | HR 65 | Ht 70.0 in | Wt 232.0 lb

## 2011-11-02 DIAGNOSIS — E039 Hypothyroidism, unspecified: Secondary | ICD-10-CM | POA: Diagnosis not present

## 2011-11-02 DIAGNOSIS — E785 Hyperlipidemia, unspecified: Secondary | ICD-10-CM | POA: Diagnosis not present

## 2011-11-02 DIAGNOSIS — I10 Essential (primary) hypertension: Secondary | ICD-10-CM | POA: Diagnosis not present

## 2011-11-02 DIAGNOSIS — Z1211 Encounter for screening for malignant neoplasm of colon: Secondary | ICD-10-CM | POA: Diagnosis not present

## 2011-11-02 DIAGNOSIS — Z0000 Encounter for general adult medical examination without abnormal findings: Secondary | ICD-10-CM

## 2011-11-02 DIAGNOSIS — R197 Diarrhea, unspecified: Secondary | ICD-10-CM

## 2011-11-02 LAB — COMPLETE METABOLIC PANEL WITHOUT GFR
ALT: 46 U/L — ABNORMAL HIGH (ref 0–35)
AST: 43 U/L — ABNORMAL HIGH (ref 0–37)
Albumin: 4.9 g/dL (ref 3.5–5.2)
Alkaline Phosphatase: 51 U/L (ref 39–117)
BUN: 21 mg/dL (ref 6–23)
CO2: 26 meq/L (ref 19–32)
Calcium: 9.7 mg/dL (ref 8.4–10.5)
Chloride: 105 meq/L (ref 96–112)
Creat: 1.03 mg/dL (ref 0.50–1.10)
GFR, Est African American: 64 mL/min
GFR, Est Non African American: 56 mL/min — ABNORMAL LOW
Glucose, Bld: 117 mg/dL — ABNORMAL HIGH (ref 70–99)
Potassium: 4.6 meq/L (ref 3.5–5.3)
Sodium: 143 meq/L (ref 135–145)
Total Bilirubin: 0.6 mg/dL (ref 0.3–1.2)
Total Protein: 7.6 g/dL (ref 6.0–8.3)

## 2011-11-02 LAB — LIPID PANEL
Cholesterol: 238 mg/dL — ABNORMAL HIGH (ref 0–200)
HDL: 39 mg/dL — ABNORMAL LOW
LDL Cholesterol: 153 mg/dL — ABNORMAL HIGH (ref 0–99)
Total CHOL/HDL Ratio: 6.1 ratio
Triglycerides: 232 mg/dL — ABNORMAL HIGH
VLDL: 46 mg/dL — ABNORMAL HIGH (ref 0–40)

## 2011-11-02 LAB — TSH: TSH: 1.744 u[IU]/mL (ref 0.350–4.500)

## 2011-11-02 MED ORDER — METOPROLOL TARTRATE 50 MG PO TABS: 50.0000 mg | ORAL_TABLET | Freq: Two times a day (BID) | ORAL | Status: DC

## 2011-11-02 MED ORDER — ATORVASTATIN CALCIUM 80 MG PO TABS: 80.0000 mg | ORAL_TABLET | Freq: Every day | ORAL | Status: DC

## 2011-11-02 MED ORDER — LEVOTHYROXINE SODIUM 50 MCG PO TABS: 50.0000 ug | ORAL_TABLET | Freq: Every day | ORAL | Status: DC

## 2011-11-02 MED ORDER — LEVOTHYROXINE SODIUM 75 MCG PO TABS: 75.0000 ug | ORAL_TABLET | Freq: Every day | ORAL | Status: DC

## 2011-11-02 MED ORDER — HYDROCHLOROTHIAZIDE 25 MG PO TABS: 25.0000 mg | ORAL_TABLET | Freq: Every day | ORAL | Status: DC

## 2011-11-02 NOTE — Progress Notes (Signed)
  Subjective:    Patient ID: Brandi Singleton, female    DOB: October 24, 1940, 71 y.o.   MRN: [id number]  HPI HTN-No CP or SOB. She's taking her medication regularly but she did not take her blood pressure pills this morning because she was fasting. She is not taking any other medications such as NSAIDs that would increase her blood pressure. No dizziness or palpitations. No side effects from the medications.  Hypothyroid. - No skin or hari changes.  Feels her thyroid is well controlled. She does take in the morning about an hour before breakfast.  Hyperlipidemia- she has been tolerating Crestor well without any side effects or myalgias. She did run out about 3 days ago. She would also like to see if there is something cheaper. She currently pays about $30 a month for that medication.   Review of Systems     Objective:   Physical Exam  Constitutional: She is oriented to person, place, and time. She appears well-developed and well-nourished.  HENT:  Head: Normocephalic and atraumatic.  Eyes: Conjunctivae are normal. Pupils are equal, round, and reactive to light.  Neck: Neck supple. No thyromegaly present.  Cardiovascular: Normal rate, regular rhythm and normal heart sounds.   Pulmonary/Chest: Effort normal and breath sounds normal.  Musculoskeletal:       Left ankle larger than the right.   Lymphadenopathy:    She has no cervical adenopathy.  Neurological: She is alert and oriented to person, place, and time.  Skin: Skin is warm and dry.  Psychiatric: She has a normal mood and affect. Her behavior is normal.          Assessment & Plan:  HTN-uncontrolled. The patient did not take her medication this morning because she was fasting. She did take her Synthroid. I asked her to get back on her medications and let's recheck with a nurse visit in about 2 months when she follows up for a repeat on her cholesterol. 90 days supplies sent to pharm.   Hyperlipidemia-she would like to try something  cheaper than the Crestor. Now there is Lipitor generic we will try changing her to atorvastatin 80 mg. I sent a prescription to her pharmacy and we can recheck her lab work in 6-8 weeks. She can come by for blood pressure check with the nurse visit around that time as well. Note she ran of her lipitor about 3 days ago. We are going to recheck her lipid panel today.  Hypothyroid - she is asymptomatic thyroid. Will recheck TSH today and adjust medication as needed. At the [facility] short-term refills.  Screening for colon cancer-she does decline a colonoscopy. She did agree to do some stool cards and we'll give her some to take home today.

## 2011-11-02 NOTE — Patient Instructions (Signed)

## 2011-11-25 NOTE — Progress Notes (Signed)
Addended by: Ellsworth Lennox on: 11/25/2011 11:34 AM   Modules accepted: Orders

## 2011-12-07 DIAGNOSIS — H33309 Unspecified retinal break, unspecified eye: Secondary | ICD-10-CM | POA: Diagnosis not present

## 2011-12-31 ENCOUNTER — Ambulatory Visit (INDEPENDENT_AMBULATORY_CARE_PROVIDER_SITE_OTHER): Payer: Medicare Other | Admitting: Family Medicine

## 2011-12-31 ENCOUNTER — Other Ambulatory Visit: Payer: Self-pay | Admitting: Family Medicine

## 2011-12-31 VITALS — BP 135/75 | HR 71

## 2011-12-31 DIAGNOSIS — I10 Essential (primary) hypertension: Secondary | ICD-10-CM

## 2011-12-31 DIAGNOSIS — E785 Hyperlipidemia, unspecified: Secondary | ICD-10-CM

## 2011-12-31 LAB — LIPID PANEL
HDL: 35 mg/dL — ABNORMAL LOW (ref >39)
Triglycerides: 220 mg/dL — ABNORMAL HIGH (ref <150)

## 2011-12-31 NOTE — Progress Notes (Signed)
Patient ID: Brandi Singleton, female   DOB: 1940-10-13, 71 y.o.   MRN: [id number] BP check and repeat lipid check    HTN- Well controlled to day. F/U in with MD in 4 months.  Hyperlipidemia - Due to recheck lipids now on lipitor. Lab slip given  C. Linford Arnold, MD.

## 2012-01-01 ENCOUNTER — Other Ambulatory Visit: Payer: Self-pay | Admitting: Family Medicine

## 2012-01-03 ENCOUNTER — Other Ambulatory Visit: Payer: Self-pay | Admitting: *Deleted

## 2012-01-03 LAB — HEPATIC FUNCTION PANEL
Albumin: 4.6 g/dL (ref 3.5–5.2)
Alkaline Phosphatase: 55 U/L (ref 39–117)
Bilirubin, Direct: 0.1 mg/dL (ref 0.0–0.3)
Indirect Bilirubin: 0.4 mg/dL (ref 0.0–0.9)
Total Protein: 7.4 g/dL (ref 6.0–8.3)

## 2012-01-03 MED ORDER — ATORVASTATIN CALCIUM 80 MG PO TABS: 80.0000 mg | ORAL_TABLET | Freq: Every day | ORAL | Status: DC

## 2012-03-03 ENCOUNTER — Other Ambulatory Visit: Payer: Self-pay | Admitting: Family Medicine

## 2012-03-03 DIAGNOSIS — E7800 Pure hypercholesterolemia, unspecified: Secondary | ICD-10-CM

## 2012-04-06 DIAGNOSIS — Z23 Encounter for immunization: Secondary | ICD-10-CM | POA: Diagnosis not present

## 2012-04-21 ENCOUNTER — Other Ambulatory Visit: Payer: Self-pay | Admitting: Family Medicine

## 2012-06-28 ENCOUNTER — Other Ambulatory Visit: Payer: Self-pay | Admitting: Family Medicine

## 2012-07-02 ENCOUNTER — Other Ambulatory Visit: Payer: Self-pay | Admitting: Family Medicine

## 2012-07-25 ENCOUNTER — Other Ambulatory Visit: Payer: Self-pay | Admitting: Family Medicine

## 2012-08-27 ENCOUNTER — Other Ambulatory Visit: Payer: Self-pay | Admitting: Family Medicine

## 2012-08-31 ENCOUNTER — Ambulatory Visit (INDEPENDENT_AMBULATORY_CARE_PROVIDER_SITE_OTHER): Payer: Medicare Other | Admitting: Family Medicine

## 2012-08-31 ENCOUNTER — Encounter: Payer: Self-pay | Admitting: Family Medicine

## 2012-08-31 VITALS — BP 149/80 | HR 70 | Ht 70.0 in | Wt 231.0 lb

## 2012-08-31 DIAGNOSIS — I10 Essential (primary) hypertension: Secondary | ICD-10-CM | POA: Diagnosis not present

## 2012-08-31 MED ORDER — ATORVASTATIN CALCIUM 80 MG PO TABS: ORAL_TABLET | ORAL | Status: DC

## 2012-08-31 MED ORDER — LEVOTHYROXINE SODIUM 75 MCG PO TABS: ORAL_TABLET | ORAL | Status: DC

## 2012-08-31 MED ORDER — HYDROCHLOROTHIAZIDE 25 MG PO TABS: ORAL_TABLET | ORAL | Status: DC

## 2012-08-31 MED ORDER — LEVOTHYROXINE SODIUM 50 MCG PO TABS: ORAL_TABLET | ORAL | Status: DC

## 2012-08-31 MED ORDER — METOPROLOL TARTRATE 50 MG PO TABS: ORAL_TABLET | ORAL | Status: DC

## 2012-08-31 NOTE — Progress Notes (Signed)
Subjective:    Patient ID: Brandi Singleton, female    DOB: 1940/09/19, 72 y.o.   MRN: [id number]  HPI HTN -  Pt denies chest pain, SOB, dizziness, or heart palpitations.  Taking meds as directed w/o problems.  Denies medication side effects.    Hyperlipidemia-taking her statin without any side effects. No myalgias. She does need refills today.  Hypothyroidism-asymptomatic. No skin or hair changes. No recent weight changes. She's been walking occasionally but not regularly for exercise.    Review of Systems BP 149/80  Pulse 70  Ht 5\' 10"  (1.778 m)  Wt 231 lb (104.781 kg)  BMI 33.15 kg/m2    No Known Allergies  Past Medical History  Diagnosis Date  . Thyroid disease   . Hypertension   . Hyperlipidemia     Past Surgical History  Procedure Laterality Date  . Tubal ligation    . Abdominal hysterectomy      History   Social History  . Marital Status: Married    Spouse Name: N/A    Number of Children: N/A  . Years of Education: N/A   Occupational History  . Not on file.   Social History Main Topics  . Smoking status: Never Smoker   . Smokeless tobacco: Not on file  . Alcohol Use: No  . Drug Use: No  . Sexually Active:    Other Topics Concern  . Not on file   Social History Narrative  . No narrative on file    No family history on file.  Outpatient Encounter Prescriptions as of 08/31/2012  Medication Sig Dispense Refill  . atorvastatin (LIPITOR) 80 MG tablet TAKE ONE TABLET BY MOUTH EVERY DAY  90 tablet  3  . hydrochlorothiazide (HYDRODIURIL) 25 MG tablet TAKE ONE TABLET BY MOUTH EVERY DAY  90 tablet  1  . levothyroxine (SYNTHROID, LEVOTHROID) 50 MCG tablet TAKE ONE TABLET BY MOUTH EVERY DAY PRN  90 tablet  0  . levothyroxine (SYNTHROID, LEVOTHROID) 75 MCG tablet TAKE ONE TABLET BY MOUTH EVERY DAY  90 tablet  1  . metoprolol (LOPRESSOR) 50 MG tablet TAKE ONE TABLET BY MOUTH TWICE DAILY  180 tablet  1  . Multiple Vitamin (MULTIVITAMIN) tablet Take 1 tablet by  mouth daily.      . Omega-3 Fatty Acids (FISH OIL) 1000 MG CAPS Take by mouth 2 (two) times daily.      . vitamin C (ASCORBIC ACID) 500 MG tablet Take 500 mg by mouth 2 (two) times daily.      . [DISCONTINUED] atorvastatin (LIPITOR) 80 MG tablet TAKE ONE TABLET BY MOUTH EVERY DAY  30 tablet  2  . [DISCONTINUED] hydrochlorothiazide (HYDRODIURIL) 25 MG tablet TAKE ONE TABLET BY MOUTH EVERY DAY  30 tablet  0  . [DISCONTINUED] levothyroxine (SYNTHROID, LEVOTHROID) 50 MCG tablet TAKE ONE TABLET BY MOUTH EVERY DAY  90 tablet  0  . [DISCONTINUED] levothyroxine (SYNTHROID, LEVOTHROID) 75 MCG tablet TAKE ONE TABLET BY MOUTH EVERY DAY  90 tablet  0  . [DISCONTINUED] metoprolol (LOPRESSOR) 50 MG tablet TAKE ONE TABLET BY MOUTH TWICE DAILY  180 tablet  1   No facility-administered encounter medications on file as of 08/31/2012.          Objective:   Physical Exam  Constitutional: She is oriented to person, place, and time. She appears well-developed and well-nourished.  HENT:  Head: Normocephalic and atraumatic.  Cardiovascular: Normal rate, regular rhythm and normal heart sounds.   Pulmonary/Chest: Effort normal and breath  sounds normal.  Neurological: She is alert and oriented to person, place, and time.  Skin: Skin is warm and dry.  Psychiatric: She has a normal mood and affect. Her behavior is normal.          Assessment & Plan:  HTN - uncontrolled urinalysis today but she does have whitecoat hypertension. She does check her blood pressures at home. Asked her to check them 2-3 times a week for the next month and if she is seeing more than 2 or 3 blood pressures with the top number over 140 she is to call the office and let us know. Refill sent to pharmacy. Otherwise followup in 6 months. Due for fasting CMP.  Encouraged diet or exercise.   Hypothyroid-due to recheck levels. She needs refills on her thyroid medication as well.  hyplipidemia - refill sent to pharmacy. Her lipids are  up-to-date. Lab Results  Component Value Date   CHOL 182 12/31/2011   HDL 35* 12/31/2011   LDLCALC 103* 12/31/2011   TRIG 220* 12/31/2011   CHOLHDL 5.2 12/31/2011   IFG - Will check A1C.

## 2012-08-31 NOTE — Patient Instructions (Addendum)
Check BP at home about 2-3 times a week and call me if they are over 130 on the top number.   DASH Diet The DASH diet stands for "Dietary Approaches to Stop Hypertension." It is a healthy eating plan that has been shown to reduce high blood pressure (hypertension) in as little as 14 days, while also possibly providing other significant health benefits. These other health benefits include reducing the risk of breast cancer after menopause and reducing the risk of type 2 diabetes, heart disease, colon cancer, and stroke. Health benefits also include weight loss and slowing kidney failure in patients with chronic kidney disease.  DIET GUIDELINES  Limit salt (sodium). Your diet should contain less than 1500 mg of sodium daily.  Limit refined or processed carbohydrates. Your diet should include mostly whole grains. Desserts and added sugars should be used sparingly.  Include small amounts of heart-healthy fats. These types of fats include nuts, oils, and tub margarine. Limit saturated and trans fats. These fats have been shown to be harmful in the body. CHOOSING FOODS  The following food groups are based on a 2000 calorie diet. See your Registered Dietitian for individual calorie needs. Grains and Grain Products (6 to 8 servings daily)  Eat More Often: Whole-wheat bread, brown rice, whole-grain or wheat pasta, quinoa, popcorn without added fat or salt (air popped).  Eat Less Often: White bread, white pasta, white rice, cornbread. Vegetables (4 to 5 servings daily)  Eat More Often: Fresh, frozen, and canned vegetables. Vegetables may be raw, steamed, roasted, or grilled with a minimal amount of fat.  Eat Less Often/Avoid: Creamed or fried vegetables. Vegetables in a cheese sauce. Fruit (4 to 5 servings daily)  Eat More Often: All fresh, canned (in natural juice), or frozen fruits. Dried fruits without added sugar. One hundred percent fruit juice ( cup [237 mL] daily).  Eat Less Often: Dried  fruits with added sugar. Canned fruit in light or heavy syrup. [employer], Fish, and Poultry (2 servings or less daily. One serving is 3 to 4 oz [85-114 g]).  Eat More Often: Ninety percent or leaner ground beef, tenderloin, sirloin. Round cuts of beef, chicken breast, [place] breast. All fish. Grill, bake, or broil your meat. Nothing should be fried.  Eat Less Often/Avoid: Fatty cuts of meat, [place], or chicken leg, thigh, or wing. Fried cuts of meat or fish. Dairy (2 to 3 servings)  Eat More Often: Low-fat or fat-free milk, low-fat plain or light yogurt, reduced-fat or part-skim cheese.  Eat Less Often/Avoid: Milk (whole, 2%).Whole milk yogurt. Full-fat cheeses. Nuts, Seeds, and Legumes (4 to 5 servings per week)  Eat More Often: All without added salt.  Eat Less Often/Avoid: Salted nuts and seeds, canned beans with added salt. Fats and Sweets (limited)  Eat More Often: Vegetable oils, tub margarines without trans fats, sugar-free gelatin. Mayonnaise and salad dressings.  Eat Less Often/Avoid: Coconut oils, palm oils, butter, stick margarine, cream, half and half, cookies, candy, pie. FOR MORE INFORMATION The Dash Diet Eating Plan: www.dashdiet.org Document Released: 06/17/2011 Document Revised: 09/20/2011 Document Reviewed: 06/17/2011 [facility] Patient Information 2013 [place], [place].

## 2012-09-11 DIAGNOSIS — R7301 Impaired fasting glucose: Secondary | ICD-10-CM | POA: Diagnosis not present

## 2012-09-11 LAB — COMPLETE METABOLIC PANEL WITH GFR
ALT: 57 U/L — ABNORMAL HIGH (ref 0–35)
Alkaline Phosphatase: 59 U/L (ref 39–117)
Potassium: 4.1 mEq/L (ref 3.5–5.3)
Sodium: 141 mEq/L (ref 135–145)
Total Bilirubin: 0.6 mg/dL (ref 0.3–1.2)
Total Protein: 7.5 g/dL (ref 6.0–8.3)

## 2012-09-11 LAB — HEMOGLOBIN A1C: Mean Plasma Glucose: 157 mg/dL — ABNORMAL HIGH (ref <117)

## 2012-09-12 ENCOUNTER — Other Ambulatory Visit: Payer: Self-pay | Admitting: Family Medicine

## 2012-09-12 DIAGNOSIS — R748 Abnormal levels of other serum enzymes: Secondary | ICD-10-CM

## 2012-09-14 ENCOUNTER — Encounter: Payer: Self-pay | Admitting: Family Medicine

## 2012-09-14 ENCOUNTER — Ambulatory Visit (HOSPITAL_BASED_OUTPATIENT_CLINIC_OR_DEPARTMENT_OTHER)
Admission: RE | Admit: 2012-09-14 | Discharge: 2012-09-14 | Disposition: A | Discharge status: Home / Self Care | Payer: Medicare Other | Source: Ambulatory Visit | Attending: Family Medicine | Admitting: Family Medicine

## 2012-09-14 DIAGNOSIS — R7989 Other specified abnormal findings of blood chemistry: Secondary | ICD-10-CM | POA: Diagnosis not present

## 2012-09-14 DIAGNOSIS — K760 Fatty (change of) liver, not elsewhere classified: Secondary | ICD-10-CM

## 2012-09-14 DIAGNOSIS — N281 Cyst of kidney, acquired: Secondary | ICD-10-CM | POA: Diagnosis not present

## 2012-09-14 DIAGNOSIS — K7689 Other specified diseases of liver: Secondary | ICD-10-CM | POA: Insufficient documentation

## 2012-09-17 ENCOUNTER — Other Ambulatory Visit: Payer: Self-pay | Admitting: Family Medicine

## 2012-09-19 ENCOUNTER — Ambulatory Visit: Payer: Medicare Other | Admitting: Family Medicine

## 2012-12-21 ENCOUNTER — Emergency Department (INDEPENDENT_AMBULATORY_CARE_PROVIDER_SITE_OTHER)
Admission: EM | Admit: 2012-12-21 | Discharge: 2012-12-21 | Disposition: A | Discharge status: Home / Self Care | Payer: Medicare Other | Source: Home / Self Care | Attending: Family Medicine | Admitting: Family Medicine

## 2012-12-21 ENCOUNTER — Emergency Department (INDEPENDENT_AMBULATORY_CARE_PROVIDER_SITE_OTHER): Payer: Medicare Other

## 2012-12-21 ENCOUNTER — Encounter: Payer: Self-pay | Admitting: Emergency Medicine

## 2012-12-21 DIAGNOSIS — W2203XA Walked into furniture, initial encounter: Secondary | ICD-10-CM

## 2012-12-21 DIAGNOSIS — S92919A Unspecified fracture of unspecified toe(s), initial encounter for closed fracture: Secondary | ICD-10-CM | POA: Diagnosis not present

## 2012-12-21 DIAGNOSIS — IMO0002 Reserved for concepts with insufficient information to code with codable children: Secondary | ICD-10-CM

## 2012-12-21 NOTE — ED Notes (Signed)
Rt second toe injury stumped today on a rocking chair, has a ring stuck on her toe, since it is swollen and bruised.

## 2012-12-21 NOTE — ED Provider Notes (Signed)
History     CSN: [id number]  Arrival date & time 12/21/12  1246   First MD Initiated Contact with Patient 12/21/12 1310      Chief Complaint  Patient presents with  . Toe Injury   HPI  Toe injury x 1 day Pt accidentally stump her 2nd toe on a rocking chair.  Has had sever toe pain since this point.  No distal numbness or paresthesias.  Also had toe ring on toe.  Has been unable to remove because of swelling.    Past Medical History  Diagnosis Date  . Thyroid disease   . Hypertension   . Hyperlipidemia     Past Surgical History  Procedure Laterality Date  . Tubal ligation    . Abdominal hysterectomy      Family History  Problem Relation Age of Onset  . Heart failure Mother   . Heart failure Father     History  Substance Use Topics  . Smoking status: Never Smoker   . Smokeless tobacco: Not on file  . Alcohol Use: No    OB History   Grav Para Term Preterm Abortions TAB SAB Ect Mult Living                  Review of Systems  All other systems reviewed and are negative.    Allergies  Review of patient's allergies indicates not on file.  Home Medications   Current Outpatient Rx  Name  Route  Sig  Dispense  Refill  . atorvastatin (LIPITOR) 80 MG tablet      TAKE ONE TABLET BY MOUTH EVERY DAY   90 tablet   3   . hydrochlorothiazide (HYDRODIURIL) 25 MG tablet      TAKE ONE TABLET BY MOUTH EVERY DAY   90 tablet   1   . levothyroxine (SYNTHROID, LEVOTHROID) 50 MCG tablet      TAKE ONE TABLET BY MOUTH EVERY DAY PRN   90 tablet   0   . levothyroxine (SYNTHROID, LEVOTHROID) 75 MCG tablet      TAKE ONE TABLET BY MOUTH EVERY DAY   90 tablet   1   . levothyroxine (SYNTHROID, LEVOTHROID) 75 MCG tablet      TAKE ONE TABLET BY MOUTH EVERY DAY   90 tablet   0   . metoprolol (LOPRESSOR) 50 MG tablet      TAKE ONE TABLET BY MOUTH TWICE DAILY   180 tablet   1   . Multiple Vitamin (MULTIVITAMIN) tablet   Oral   Take 1 tablet by mouth  daily.         . Omega-3 Fatty Acids (FISH OIL) 1000 MG CAPS   Oral   Take by mouth 2 (two) times daily.         . vitamin C (ASCORBIC ACID) 500 MG tablet   Oral   Take 500 mg by mouth 2 (two) times daily.           BP 158/85  Pulse 71  Temp(Src) 97.8 F (36.6 C) (Oral)  Ht 5\' 10"  (1.778 m)  Wt 227 lb (102.967 kg)  BMI 32.57 kg/m2  SpO2 94%  Physical Exam  Constitutional: She appears well-developed and well-nourished.  HENT:  Head: Normocephalic and atraumatic.  Eyes: Pupils are equal, round, and reactive to light.  Neck: Normal range of motion.  Cardiovascular: Normal rate.   Pulmonary/Chest: Effort normal.  Abdominal: Soft.  Musculoskeletal: Normal range of motion.  Neurological: She is alert.  Skin: Skin is warm.    ED Course  Procedures (including critical care time)  Labs Reviewed - No data to display Dg Foot Complete Right  12/21/2012   *RADIOLOGY REPORT*  Clinical Data: Injured 2nd toe with pain and bruising  RIGHT FOOT COMPLETE - 3+ VIEW  Comparison: None.  Findings: There is an oblique fracture through the mid portion of the proximal phalanx of the right second toe without significant displacement.  No other fracture is seen.  The joint spaces appear normal.  IMPRESSION: Oblique nondisplaced fracture of the proximal phalanx of the right second toe.   Original Report Authenticated By: Dwyane Dee, M.D.     1. Closed fracture of proximal phalanx, initial encounter       MDM  Area buddy taped and placed and post op shoe.  NSAIDs for pain and inflammation. Follow up with sports medicine in 1-2 weeks.  Discussed general and MSK red flags.  Follow up as needed.     The patient and/or caregiver has been counseled thoroughly with regard to treatment plan and/or medications prescribed including dosage, schedule, interactions, rationale for use, and possible side effects and they verbalize understanding. Diagnoses and expected course of recovery discussed  and will return if not improved as expected or if the condition worsens. Patient and/or caregiver verbalized understanding.             Doree Albee, MD 12/21/12 1354

## 2012-12-27 DIAGNOSIS — S92919A Unspecified fracture of unspecified toe(s), initial encounter for closed fracture: Secondary | ICD-10-CM | POA: Diagnosis not present

## 2013-02-12 ENCOUNTER — Other Ambulatory Visit: Payer: Self-pay | Admitting: Family Medicine

## 2013-03-16 ENCOUNTER — Other Ambulatory Visit: Payer: Self-pay | Admitting: Family Medicine

## 2013-03-28 ENCOUNTER — Other Ambulatory Visit: Payer: Self-pay | Admitting: Family Medicine

## 2013-04-21 DIAGNOSIS — Z23 Encounter for immunization: Secondary | ICD-10-CM | POA: Diagnosis not present

## 2013-06-15 ENCOUNTER — Other Ambulatory Visit: Payer: Self-pay | Admitting: Family Medicine

## 2013-07-17 ENCOUNTER — Ambulatory Visit (INDEPENDENT_AMBULATORY_CARE_PROVIDER_SITE_OTHER): Payer: Medicare Other | Admitting: Family Medicine

## 2013-07-17 ENCOUNTER — Encounter: Payer: Self-pay | Admitting: Family Medicine

## 2013-07-17 VITALS — BP 157/84 | HR 75 | Temp 98.0°F | Ht 70.0 in | Wt 226.0 lb

## 2013-07-17 DIAGNOSIS — K7689 Other specified diseases of liver: Secondary | ICD-10-CM

## 2013-07-17 DIAGNOSIS — R7301 Impaired fasting glucose: Secondary | ICD-10-CM

## 2013-07-17 DIAGNOSIS — I10 Essential (primary) hypertension: Secondary | ICD-10-CM | POA: Diagnosis not present

## 2013-07-17 DIAGNOSIS — E039 Hypothyroidism, unspecified: Secondary | ICD-10-CM | POA: Diagnosis not present

## 2013-07-17 DIAGNOSIS — K760 Fatty (change of) liver, not elsewhere classified: Secondary | ICD-10-CM

## 2013-07-17 LAB — POCT GLYCOSYLATED HEMOGLOBIN (HGB A1C): HEMOGLOBIN A1C: 6.3

## 2013-07-17 MED ORDER — LEVOTHYROXINE SODIUM 50 MCG PO TABS: ORAL_TABLET | ORAL | Status: DC

## 2013-07-17 MED ORDER — METOPROLOL TARTRATE 50 MG PO TABS: ORAL_TABLET | ORAL | Status: DC

## 2013-07-17 MED ORDER — ATORVASTATIN CALCIUM 80 MG PO TABS: ORAL_TABLET | ORAL | Status: DC

## 2013-07-17 MED ORDER — HYDROCHLOROTHIAZIDE 25 MG PO TABS: ORAL_TABLET | ORAL | Status: DC

## 2013-07-17 MED ORDER — LEVOTHYROXINE SODIUM 75 MCG PO TABS: ORAL_TABLET | ORAL | Status: DC

## 2013-07-17 NOTE — Progress Notes (Signed)
Subjective:    Patient ID: Brandi Singleton, female    DOB: [birth year], 73 y.o.   MRN: [id number]  HPI Hypertension- Pt denies chest pain, SOB, dizziness, or heart palpitations.  Taking meds as directed w/o problems.  Denies medication side effects.  Home BPs running mostly in the 130s at home.   Hypothyroid - no skin or hair changes. No weight changes.    We did labwork on her last February. Her A1c was 7.1 at that time. She was told to come in to discuss new diagnosis of diabetes but never followed back up.  Fatty liver disease-we did do an ultrasound in March which showed fatty liver. She was post followup for repeat liver enzymes but did not.   Review of Systems  BP 157/84  Pulse 75  Temp(Src) 98 F (36.7 C)  Ht 5\' 10"  (1.778 m)  Wt 226 lb (102.513 kg)  BMI 32.43 kg/m2    Not on File  Past Medical History  Diagnosis Date  . Thyroid disease   . Hypertension   . Hyperlipidemia     Past Surgical History  Procedure Laterality Date  . Tubal ligation    . Abdominal hysterectomy      History   Social History  . Marital Status: Married    Spouse Name: N/A    Number of Children: N/A  . Years of Education: N/A   Occupational History  . Not on file.   Social History Main Topics  . Smoking status: Never Smoker   . Smokeless tobacco: Not on file  . Alcohol Use: No  . Drug Use: No  . Sexual Activity: Not on file   Other Topics Concern  . Not on file   Social History Narrative  . No narrative on file    Family History  Problem Relation Age of Onset  . Heart failure Mother   . Heart failure Father     Outpatient Encounter Prescriptions as of 07/17/2013  Medication Sig  . atorvastatin (LIPITOR) 80 MG tablet TAKE ONE TABLET BY MOUTH EVERY DAY  . hydrochlorothiazide (HYDRODIURIL) 25 MG tablet TAKE ONE TABLET BY MOUTH ONCE DAILY  . levothyroxine (SYNTHROID, LEVOTHROID) 50 MCG tablet TAKE ONE TABLET BY MOUTH EVERY DAY PRN  . levothyroxine (SYNTHROID,  LEVOTHROID) 75 MCG tablet TAKE ONE TABLET BY MOUTH EVERY DAY  . metoprolol (LOPRESSOR) 50 MG tablet TAKE ONE TABLET BY MOUTH TWICE DAILY  . Multiple Vitamin (MULTIVITAMIN) tablet Take 1 tablet by mouth daily.  . Omega-3 Fatty Acids (FISH OIL) 1000 MG CAPS Take by mouth 2 (two) times daily.  . vitamin C (ASCORBIC ACID) 500 MG tablet Take 500 mg by mouth 2 (two) times daily.  . [DISCONTINUED] atorvastatin (LIPITOR) 80 MG tablet TAKE ONE TABLET BY MOUTH EVERY DAY  . [DISCONTINUED] hydrochlorothiazide (HYDRODIURIL) 25 MG tablet TAKE ONE TABLET BY MOUTH ONCE DAILY  . [DISCONTINUED] levothyroxine (SYNTHROID, LEVOTHROID) 50 MCG tablet TAKE ONE TABLET BY MOUTH EVERY DAY PRN  . [DISCONTINUED] levothyroxine (SYNTHROID, LEVOTHROID) 75 MCG tablet TAKE ONE TABLET BY MOUTH EVERY DAY  . [DISCONTINUED] metoprolol (LOPRESSOR) 50 MG tablet TAKE ONE TABLET BY MOUTH TWICE DAILY  . [DISCONTINUED] levothyroxine (SYNTHROID, LEVOTHROID) 75 MCG tablet TAKE ONE TABLET BY MOUTH ONCE DAILY          Objective:   Physical Exam  Constitutional: She is oriented to person, place, and time. She appears well-developed and well-nourished.  HENT:  Head: Normocephalic and atraumatic.  Cardiovascular: Normal rate, regular rhythm and  normal heart sounds.   Pulmonary/Chest: Effort normal and breath sounds normal.  Neurological: She is alert and oriented to person, place, and time.  Skin: Skin is warm and dry.  Psychiatric: She has a normal mood and affect. Her behavior is normal.          Assessment & Plan:  HTN - she reports home blood pressures are well controlled. I encouraged her to bring in her home monitor so that we can verify that accurate. If it is then we can do about home numbers which do seem to be well-controlled. Otherwise we will need to make adjustments.  Hypothyroid - she's asymptomatic. Due to recheck TSH.  IFG - A1c is 6.3 today. Continue work on diet and exercise and recheck in 6 months.  Fatty  liver disease-repeat liver enzymes. Last [place] 09/2012 confirming fatty liver.

## 2013-07-19 LAB — BASIC METABOLIC PANEL WITH GFR
BUN: 14 mg/dL (ref 6–23)
CHLORIDE: 102 meq/L (ref 96–112)
CO2: 26 meq/L (ref 19–32)
CREATININE: 1.04 mg/dL (ref 0.50–1.10)
Calcium: 9.4 mg/dL (ref 8.4–10.5)
GFR, Est African American: 63 mL/min
GFR, Est Non African American: 55 mL/min — ABNORMAL LOW
GLUCOSE: 102 mg/dL — AB (ref 70–99)
Potassium: 4.1 mEq/L (ref 3.5–5.3)
Sodium: 142 mEq/L (ref 135–145)

## 2013-07-19 LAB — LIPID PANEL
CHOL/HDL RATIO: 5 ratio
Cholesterol: 179 mg/dL (ref 0–200)
HDL: 36 mg/dL — ABNORMAL LOW (ref >39)
LDL CALC: 103 mg/dL — AB (ref 0–99)
Triglycerides: 202 mg/dL — ABNORMAL HIGH (ref <150)
VLDL: 40 mg/dL (ref 0–40)

## 2013-07-19 LAB — TSH: TSH: 1.655 u[IU]/mL (ref 0.350–4.500)

## 2013-07-19 LAB — HEPATIC FUNCTION PANEL
ALK PHOS: 74 U/L (ref 39–117)
ALT: 34 U/L (ref 0–35)
AST: 34 U/L (ref 0–37)
Albumin: 4.3 g/dL (ref 3.5–5.2)
BILIRUBIN INDIRECT: 0.6 mg/dL (ref 0.0–0.9)
Bilirubin, Direct: 0.1 mg/dL (ref 0.0–0.3)
Total Bilirubin: 0.7 mg/dL (ref 0.3–1.2)
Total Protein: 7.7 g/dL (ref 6.0–8.3)

## 2013-07-21 DIAGNOSIS — R059 Cough, unspecified: Secondary | ICD-10-CM | POA: Diagnosis not present

## 2013-07-21 DIAGNOSIS — Z87891 Personal history of nicotine dependence: Secondary | ICD-10-CM | POA: Diagnosis not present

## 2013-07-21 DIAGNOSIS — J986 Disorders of diaphragm: Secondary | ICD-10-CM | POA: Diagnosis not present

## 2013-07-21 DIAGNOSIS — E785 Hyperlipidemia, unspecified: Secondary | ICD-10-CM | POA: Diagnosis present

## 2013-07-21 DIAGNOSIS — B009 Herpesviral infection, unspecified: Secondary | ICD-10-CM | POA: Diagnosis not present

## 2013-07-21 DIAGNOSIS — Z23 Encounter for immunization: Secondary | ICD-10-CM | POA: Diagnosis not present

## 2013-07-21 DIAGNOSIS — A419 Sepsis, unspecified organism: Secondary | ICD-10-CM | POA: Diagnosis not present

## 2013-07-21 DIAGNOSIS — Z79899 Other long term (current) drug therapy: Secondary | ICD-10-CM | POA: Diagnosis not present

## 2013-07-21 DIAGNOSIS — N12 Tubulo-interstitial nephritis, not specified as acute or chronic: Secondary | ICD-10-CM | POA: Diagnosis not present

## 2013-07-21 DIAGNOSIS — R0902 Hypoxemia: Secondary | ICD-10-CM | POA: Diagnosis not present

## 2013-07-21 DIAGNOSIS — I2699 Other pulmonary embolism without acute cor pulmonale: Secondary | ICD-10-CM | POA: Diagnosis not present

## 2013-07-21 DIAGNOSIS — R0602 Shortness of breath: Secondary | ICD-10-CM | POA: Diagnosis not present

## 2013-07-21 DIAGNOSIS — Z7982 Long term (current) use of aspirin: Secondary | ICD-10-CM | POA: Diagnosis not present

## 2013-07-21 DIAGNOSIS — R079 Chest pain, unspecified: Secondary | ICD-10-CM | POA: Diagnosis not present

## 2013-07-21 DIAGNOSIS — R6520 Severe sepsis without septic shock: Secondary | ICD-10-CM | POA: Diagnosis present

## 2013-07-21 DIAGNOSIS — N390 Urinary tract infection, site not specified: Secondary | ICD-10-CM | POA: Diagnosis not present

## 2013-07-21 DIAGNOSIS — J9600 Acute respiratory failure, unspecified whether with hypoxia or hypercapnia: Secondary | ICD-10-CM | POA: Diagnosis not present

## 2013-07-21 DIAGNOSIS — R109 Unspecified abdominal pain: Secondary | ICD-10-CM | POA: Diagnosis not present

## 2013-07-21 DIAGNOSIS — Z90710 Acquired absence of both cervix and uterus: Secondary | ICD-10-CM | POA: Diagnosis not present

## 2013-07-21 DIAGNOSIS — N289 Disorder of kidney and ureter, unspecified: Secondary | ICD-10-CM | POA: Diagnosis not present

## 2013-07-21 DIAGNOSIS — R112 Nausea with vomiting, unspecified: Secondary | ICD-10-CM | POA: Diagnosis not present

## 2013-07-21 DIAGNOSIS — E039 Hypothyroidism, unspecified: Secondary | ICD-10-CM | POA: Diagnosis present

## 2013-07-21 DIAGNOSIS — I10 Essential (primary) hypertension: Secondary | ICD-10-CM | POA: Diagnosis not present

## 2013-07-21 DIAGNOSIS — A498 Other bacterial infections of unspecified site: Secondary | ICD-10-CM | POA: Diagnosis not present

## 2013-07-21 DIAGNOSIS — J9819 Other pulmonary collapse: Secondary | ICD-10-CM | POA: Diagnosis not present

## 2013-07-21 DIAGNOSIS — I059 Rheumatic mitral valve disease, unspecified: Secondary | ICD-10-CM | POA: Diagnosis not present

## 2013-07-21 DIAGNOSIS — N281 Cyst of kidney, acquired: Secondary | ICD-10-CM | POA: Diagnosis not present

## 2013-07-21 DIAGNOSIS — R0609 Other forms of dyspnea: Secondary | ICD-10-CM | POA: Diagnosis not present

## 2013-07-21 DIAGNOSIS — J029 Acute pharyngitis, unspecified: Secondary | ICD-10-CM | POA: Diagnosis not present

## 2013-07-21 LAB — HEMOGLOBIN A1C: Hgb A1c MFr Bld: 6.5 % — AB (ref 4.0–6.0)

## 2013-07-22 LAB — TSH: TSH: 2.92 u[IU]/mL (ref 0.41–5.90)

## 2013-07-26 ENCOUNTER — Telehealth: Payer: Self-pay | Admitting: *Deleted

## 2013-07-26 NOTE — Telephone Encounter (Signed)
Pt's results given.Brandi Singleton

## 2013-08-07 ENCOUNTER — Encounter: Payer: Self-pay | Admitting: Family Medicine

## 2013-08-07 ENCOUNTER — Ambulatory Visit (INDEPENDENT_AMBULATORY_CARE_PROVIDER_SITE_OTHER): Payer: Medicare Other | Admitting: Family Medicine

## 2013-08-07 VITALS — BP 137/77 | HR 98 | Temp 98.1°F | Ht 70.0 in | Wt 214.0 lb

## 2013-08-07 DIAGNOSIS — A419 Sepsis, unspecified organism: Secondary | ICD-10-CM | POA: Diagnosis not present

## 2013-08-07 DIAGNOSIS — J209 Acute bronchitis, unspecified: Secondary | ICD-10-CM

## 2013-08-07 DIAGNOSIS — N390 Urinary tract infection, site not specified: Secondary | ICD-10-CM | POA: Diagnosis not present

## 2013-08-07 LAB — POCT URINALYSIS DIPSTICK
Bilirubin, UA: NEGATIVE
Glucose, UA: NEGATIVE
Ketones, UA: NEGATIVE
Leukocytes, UA: NEGATIVE
NITRITE UA: NEGATIVE
PROTEIN UA: NEGATIVE
RBC UA: NEGATIVE
Spec Grav, UA: 1.015
UROBILINOGEN UA: 0.2
pH, UA: 5.5

## 2013-08-07 MED ORDER — DOXYCYCLINE HYCLATE 100 MG PO TABS: 100.0000 mg | ORAL_TABLET | Freq: Two times a day (BID) | ORAL | Status: DC

## 2013-08-07 NOTE — Progress Notes (Signed)
   Subjective:    Patient ID: Brandi Singleton, female    DOB: Jan 21, 1941, 73 y.o.   MRN: [id number]  HPI  Here for hospital followup  For sepsis from possible e coli UTI.  Upon admission her white blood cell count was 38,000. There was some question of stranding near the kidneys with possible pyelonephritis. She was noted to have elevated right hemidiaphragm but it was unchanged from previous. She was initially treated with Rocephin and azithromycin. This was discontinued after pneumonia was ruled out. She had a CT scan of the abdomen and pelvis which showed fat stranding in the right upper quadrant. The differential included hepatitis cholecystitis or peptic ulcer disease. She had a normal ultrasound the abdomen. She was negative for pulmonary embolism by a CTA. Was d/c home on cefdinir and completed her ABX.  She has been feeling much better. Has pulm f/u later this week.  Denies SOB.    Cough started a couple of days ago.  Can hear a rattle in her chest and lung.  Sputum is clear.  Never smoker.  Mild ST and no ear pain.  No fever, chills or sweats.  Took mucinex DM.  No GI sxs with it.    Review of Systems     Objective:   Physical Exam  Constitutional: She is oriented to person, place, and time. She appears well-developed and well-nourished.  HENT:  Head: Normocephalic and atraumatic.  Right Ear: External ear normal.  Left Ear: External ear normal.  Nose: Nose normal.  Mouth/Throat: Oropharynx is clear and moist.  TMs and canals are clear.   Eyes: Conjunctivae and EOM are normal. Pupils are equal, round, and reactive to light.  Neck: Neck supple. No thyromegaly present.  Cardiovascular: Normal rate, regular rhythm and normal heart sounds.   Pulmonary/Chest: Effort normal and breath sounds normal. She has no wheezes.  Lymphadenopathy:    She has no cervical adenopathy.  Neurological: She is alert and oriented to person, place, and time.  Skin: Skin is warm and dry.  Psychiatric: She  has a normal mood and affect.          Assessment & Plan:  F/U sepsis from UTI - repeat UA and culture today.  She is feeling much better and did complete her antibiotics.  Acute bronchitis-normally I would hold off on treating this with antibiotics as early into the illness. This certainly could be viral. But with her recent episode of sepsis and I worry about her immune system still trying to rebound from this under go ahead and put her on antibiotic. If she feels that she's getting worse, becoming short of breath, or noticing a change in color for sputum and please give [place] a call back. She does have a followup with pulmonology with her this week.

## 2013-08-08 ENCOUNTER — Encounter: Payer: Self-pay | Admitting: *Deleted

## 2013-08-09 DIAGNOSIS — R0902 Hypoxemia: Secondary | ICD-10-CM | POA: Diagnosis not present

## 2013-08-09 LAB — URINE CULTURE: Colony Count: 4000

## 2013-08-14 DIAGNOSIS — R0902 Hypoxemia: Secondary | ICD-10-CM | POA: Diagnosis not present

## 2013-08-14 DIAGNOSIS — J986 Disorders of diaphragm: Secondary | ICD-10-CM | POA: Diagnosis not present

## 2013-08-15 DIAGNOSIS — R0902 Hypoxemia: Secondary | ICD-10-CM | POA: Diagnosis not present

## 2013-09-12 ENCOUNTER — Other Ambulatory Visit: Payer: Self-pay | Admitting: Family Medicine

## 2013-09-12 DIAGNOSIS — Z1231 Encounter for screening mammogram for malignant neoplasm of breast: Secondary | ICD-10-CM

## 2013-09-13 ENCOUNTER — Ambulatory Visit (INDEPENDENT_AMBULATORY_CARE_PROVIDER_SITE_OTHER): Payer: Medicare Other

## 2013-09-13 DIAGNOSIS — Z1231 Encounter for screening mammogram for malignant neoplasm of breast: Secondary | ICD-10-CM | POA: Diagnosis not present

## 2013-10-02 DIAGNOSIS — R0902 Hypoxemia: Secondary | ICD-10-CM | POA: Diagnosis not present

## 2013-10-21 ENCOUNTER — Other Ambulatory Visit: Payer: Self-pay | Admitting: Family Medicine

## 2014-01-14 ENCOUNTER — Other Ambulatory Visit: Payer: Self-pay | Admitting: Family Medicine

## 2014-02-26 ENCOUNTER — Other Ambulatory Visit: Payer: Self-pay | Admitting: Family Medicine

## 2014-03-05 ENCOUNTER — Ambulatory Visit (INDEPENDENT_AMBULATORY_CARE_PROVIDER_SITE_OTHER): Payer: Medicare Other | Admitting: Family Medicine

## 2014-03-05 ENCOUNTER — Encounter: Payer: Self-pay | Admitting: Family Medicine

## 2014-03-05 VITALS — BP 149/76 | HR 74

## 2014-03-05 DIAGNOSIS — I10 Essential (primary) hypertension: Secondary | ICD-10-CM | POA: Diagnosis not present

## 2014-03-05 DIAGNOSIS — R7301 Impaired fasting glucose: Secondary | ICD-10-CM

## 2014-03-05 DIAGNOSIS — E039 Hypothyroidism, unspecified: Secondary | ICD-10-CM

## 2014-03-05 DIAGNOSIS — E348 Other specified endocrine disorders: Secondary | ICD-10-CM | POA: Diagnosis not present

## 2014-03-05 DIAGNOSIS — Z23 Encounter for immunization: Secondary | ICD-10-CM

## 2014-03-05 LAB — POCT GLYCOSYLATED HEMOGLOBIN (HGB A1C): HEMOGLOBIN A1C: 7.4

## 2014-03-05 MED ORDER — HYDROCHLOROTHIAZIDE 25 MG PO TABS: ORAL_TABLET | ORAL | Status: DC

## 2014-03-05 MED ORDER — METFORMIN HCL 500 MG PO TABS: 500.0000 mg | ORAL_TABLET | Freq: Two times a day (BID) | ORAL | Status: DC

## 2014-03-05 MED ORDER — LEVOTHYROXINE SODIUM 75 MCG PO TABS: ORAL_TABLET | ORAL | Status: DC

## 2014-03-05 MED ORDER — METOPROLOL TARTRATE 50 MG PO TABS: ORAL_TABLET | ORAL | Status: DC

## 2014-03-05 NOTE — Progress Notes (Signed)
   Subjective:    Patient ID: Brandi Singleton, female    DOB: 07/10/1941, 73 y.o.   MRN: [id number]  Hypertension   Hypertension- Pt denies chest pain, SOB, dizziness, or heart palpitations.  Taking meds as directed w/o problems.  Denies medication side effects.  Seh has been out of her hctz for about 2 weeks.    IFG - no hypoglycemic events. No wounds or sores that are not healing well. No increased thirst or urination. Checking glucose at home.   Hypothyroid - no change in skin or hair. Has gained weight but says she hasn't been eating the best as has been stressed.    Review of Systems     Objective:   Physical Exam  Constitutional: She is oriented to person, place, and time. She appears well-developed and well-nourished.  HENT:  Head: Normocephalic and atraumatic.  Cardiovascular: Normal rate, regular rhythm and normal heart sounds.   Pulmonary/Chest: Effort normal and breath sounds normal.  Neurological: She is alert and oriented to person, place, and time.  Skin: Skin is warm and dry.  Psychiatric: She has a normal mood and affect. Her behavior is normal.          Assessment & Plan:  Flu shot given today.

## 2014-03-05 NOTE — Assessment & Plan Note (Signed)
Uncontrolled but out of med x 2 weeks. Will restart med and f/u in 3 months.

## 2014-03-05 NOTE — Assessment & Plan Note (Signed)
Well controlled on current regimen. Recheck  In 6 months.

## 2014-03-05 NOTE — Assessment & Plan Note (Addendum)
Uncontrolled. A1C is 7.4.  Discussed dx.  Will start metformin.  Has eye exam scheduled.  Will see Dr. Dianna Rossetti in [place].

## 2014-06-05 ENCOUNTER — Other Ambulatory Visit: Payer: Self-pay | Admitting: Family Medicine

## 2014-08-30 ENCOUNTER — Other Ambulatory Visit: Payer: Self-pay | Admitting: Family Medicine

## 2014-08-30 ENCOUNTER — Ambulatory Visit (INDEPENDENT_AMBULATORY_CARE_PROVIDER_SITE_OTHER): Payer: Medicare Other | Admitting: Family Medicine

## 2014-08-30 ENCOUNTER — Encounter: Payer: Self-pay | Admitting: Family Medicine

## 2014-08-30 VITALS — BP 142/87 | HR 77 | Wt 221.0 lb

## 2014-08-30 DIAGNOSIS — I10 Essential (primary) hypertension: Secondary | ICD-10-CM | POA: Diagnosis not present

## 2014-08-30 DIAGNOSIS — IMO0002 Reserved for concepts with insufficient information to code with codable children: Secondary | ICD-10-CM

## 2014-08-30 DIAGNOSIS — Z1231 Encounter for screening mammogram for malignant neoplasm of breast: Secondary | ICD-10-CM

## 2014-08-30 DIAGNOSIS — E038 Other specified hypothyroidism: Secondary | ICD-10-CM | POA: Diagnosis not present

## 2014-08-30 DIAGNOSIS — E1165 Type 2 diabetes mellitus with hyperglycemia: Secondary | ICD-10-CM

## 2014-08-30 LAB — POCT GLYCOSYLATED HEMOGLOBIN (HGB A1C): Hemoglobin A1C: 6.9

## 2014-08-30 LAB — POCT UA - MICROALBUMIN
CREATININE, POC: 100 mg/dL
MICROALBUMIN (UR) POC: 10 mg/L

## 2014-08-30 NOTE — Progress Notes (Signed)
   Subjective:    Patient ID: Brandi Singleton, female    DOB: June 20, 1941, 75 y.o.   MRN: [id number]  HPI Diabetes - no hypoglycemic events. No wounds or sores that are not healing well. No increased thirst or urination. Checking glucose at home. Taking medications as prescribed without any side effects. Doing wll on the metformin.    Hypertension- Pt denies chest pain, SOB, dizziness, or heart palpitations.  Taking meds as directed w/o problems.  Denies medication side effects.  She is not exercising regularly right now.  Hypothyroid - No change in skin, hair, or energy level. Taking her medications regular without any side affects or problems.  Review of Systems     Objective:   Physical Exam  Constitutional: She is oriented to person, place, and time. She appears well-developed and well-nourished.  HENT:  Head: Normocephalic and atraumatic.  Cardiovascular: Normal rate, regular rhythm and normal heart sounds.   Pulmonary/Chest: Effort normal and breath sounds normal.  Neurological: She is alert and oriented to person, place, and time.  Skin: Skin is warm and dry.  Psychiatric: She has a normal mood and affect. Her behavior is normal.          Assessment & Plan:  DM- given additional handouts and information about dietary measures. Can also consider referral for diet and nutrition counseling. This is typically covered by insurance. Also due for urine microalbumin today. We will call to get her eye exam.  HTN - discussed the importance of working on diet and regular exercise.   Hypothyroid - due to recheck levels. Lab slip presented today. Lab Results  Component Value Date   TSH 2.92 07/22/2013

## 2014-08-31 ENCOUNTER — Other Ambulatory Visit: Payer: Self-pay | Admitting: Family Medicine

## 2014-09-01 ENCOUNTER — Other Ambulatory Visit: Payer: Self-pay | Admitting: Family Medicine

## 2014-09-18 ENCOUNTER — Ambulatory Visit: Payer: Medicare Other

## 2014-09-19 ENCOUNTER — Ambulatory Visit (INDEPENDENT_AMBULATORY_CARE_PROVIDER_SITE_OTHER): Payer: Medicare Other

## 2014-09-19 DIAGNOSIS — Z1231 Encounter for screening mammogram for malignant neoplasm of breast: Secondary | ICD-10-CM | POA: Diagnosis not present

## 2014-10-04 ENCOUNTER — Other Ambulatory Visit: Payer: Self-pay | Admitting: Family Medicine

## 2014-11-01 ENCOUNTER — Other Ambulatory Visit: Payer: Self-pay | Admitting: Family Medicine

## 2014-11-01 NOTE — Telephone Encounter (Signed)
Needs labs before future refills

## 2014-11-28 ENCOUNTER — Other Ambulatory Visit: Payer: Self-pay | Admitting: Family Medicine

## 2014-12-16 ENCOUNTER — Encounter: Payer: Self-pay | Admitting: Family Medicine

## 2014-12-16 ENCOUNTER — Ambulatory Visit (INDEPENDENT_AMBULATORY_CARE_PROVIDER_SITE_OTHER): Payer: Medicare Other | Admitting: Family Medicine

## 2014-12-16 VITALS — BP 144/77 | HR 74 | Ht 70.0 in | Wt 217.0 lb

## 2014-12-16 DIAGNOSIS — IMO0002 Reserved for concepts with insufficient information to code with codable children: Secondary | ICD-10-CM

## 2014-12-16 DIAGNOSIS — Z23 Encounter for immunization: Secondary | ICD-10-CM | POA: Diagnosis not present

## 2014-12-16 DIAGNOSIS — E039 Hypothyroidism, unspecified: Secondary | ICD-10-CM | POA: Diagnosis not present

## 2014-12-16 DIAGNOSIS — I10 Essential (primary) hypertension: Secondary | ICD-10-CM | POA: Diagnosis not present

## 2014-12-16 DIAGNOSIS — E1165 Type 2 diabetes mellitus with hyperglycemia: Secondary | ICD-10-CM

## 2014-12-16 LAB — POCT GLYCOSYLATED HEMOGLOBIN (HGB A1C): Hemoglobin A1C: 7.2

## 2014-12-16 MED ORDER — LEVOTHYROXINE SODIUM 50 MCG PO TABS: ORAL_TABLET | ORAL | Status: DC

## 2014-12-16 MED ORDER — HYDROCHLOROTHIAZIDE 25 MG PO TABS: 25.0000 mg | ORAL_TABLET | Freq: Every day | ORAL | Status: DC

## 2014-12-16 MED ORDER — METFORMIN HCL 1000 MG PO TABS: 1000.0000 mg | ORAL_TABLET | Freq: Two times a day (BID) | ORAL | Status: DC

## 2014-12-16 MED ORDER — METFORMIN HCL 500 MG PO TABS: 500.0000 mg | ORAL_TABLET | Freq: Two times a day (BID) | ORAL | Status: DC

## 2014-12-16 MED ORDER — METOPROLOL TARTRATE 50 MG PO TABS: 50.0000 mg | ORAL_TABLET | Freq: Two times a day (BID) | ORAL | Status: DC

## 2014-12-16 MED ORDER — ATORVASTATIN CALCIUM 80 MG PO TABS: 80.0000 mg | ORAL_TABLET | Freq: Every day | ORAL | Status: DC

## 2014-12-16 NOTE — Patient Instructions (Signed)
Increase metformin to 1000 mg.

## 2014-12-16 NOTE — Progress Notes (Signed)
   Subjective:    Patient ID: Brandi Singleton, female    DOB: 10-05-1940, 74 y.o.   MRN: [id number]  HPI  Diabetes - no hypoglycemic events. No wounds or sores that are not healing well. No increased thirst or urination. No checking glucose at home. Taking medications as prescribed without any side effects.  Hypertension- Pt denies chest pain, SOB, dizziness, or heart palpitations.  Taking meds as directed w/o problems.  Denies medication side effects.    Hypothyroid - Due for refill.  No recent skin or hair changes.  No major weight changes.    Review of Systems     Objective:   Physical Exam  Constitutional: She is oriented to person, place, and time. She appears well-developed and well-nourished.  HENT:  Head: Normocephalic and atraumatic.  Cardiovascular: Normal rate, regular rhythm and normal heart sounds.   Pulmonary/Chest: Effort normal and breath sounds normal.  Neurological: She is alert and oriented to person, place, and time.  Skin: Skin is warm and dry.  Psychiatric: She has a normal mood and affect. Her behavior is normal.          Assessment & Plan:  DM- hemoglobin A1c uncontrolled today at 7.2. We discussed several options. For now will increase metformin 2000 mg twice a day and discussed the importance of diet and exercise. Continue Lipitor 80 mg.  Hypothyroid -due to recheck TSH. Will adjust if needed. Unfortunately there was a mosquito medication about getting her labs in February so they were never done. She will go as soon as possible.   HTN - her blood pressure is above goal today. We'll hold off on changing her regimen today just because she said she feels very stressed. She got a phone call from her ex-husband right before the appointment about some concerns. Will recheck at next office visit. If still elevated at that time in we will need to adjust the medication.

## 2014-12-17 ENCOUNTER — Encounter: Payer: Self-pay | Admitting: Family Medicine

## 2014-12-17 DIAGNOSIS — I10 Essential (primary) hypertension: Secondary | ICD-10-CM | POA: Diagnosis not present

## 2014-12-17 DIAGNOSIS — E038 Other specified hypothyroidism: Secondary | ICD-10-CM | POA: Diagnosis not present

## 2014-12-17 DIAGNOSIS — E1165 Type 2 diabetes mellitus with hyperglycemia: Secondary | ICD-10-CM | POA: Diagnosis not present

## 2014-12-17 DIAGNOSIS — E119 Type 2 diabetes mellitus without complications: Secondary | ICD-10-CM | POA: Diagnosis not present

## 2014-12-18 ENCOUNTER — Other Ambulatory Visit: Payer: Self-pay | Admitting: *Deleted

## 2014-12-18 DIAGNOSIS — R748 Abnormal levels of other serum enzymes: Secondary | ICD-10-CM

## 2014-12-18 LAB — LIPID PANEL
Cholesterol: 303 mg/dL — ABNORMAL HIGH (ref 0–200)
HDL: 32 mg/dL — ABNORMAL LOW (ref >=46)
LDL Cholesterol: 213 mg/dL — ABNORMAL HIGH (ref 0–99)
Total CHOL/HDL Ratio: 9.5 Ratio
Triglycerides: 291 mg/dL — ABNORMAL HIGH (ref <150)
VLDL: 58 mg/dL — AB (ref 0–40)

## 2014-12-18 LAB — COMPLETE METABOLIC PANEL WITH GFR
ALBUMIN: 4.3 g/dL (ref 3.5–5.2)
ALK PHOS: 61 U/L (ref 39–117)
ALT: 74 U/L — ABNORMAL HIGH (ref 0–35)
AST: 46 U/L — AB (ref 0–37)
BILIRUBIN TOTAL: 0.7 mg/dL (ref 0.2–1.2)
BUN: 17 mg/dL (ref 6–23)
CHLORIDE: 103 meq/L (ref 96–112)
CO2: 25 mEq/L (ref 19–32)
CREATININE: 1.15 mg/dL — AB (ref 0.50–1.10)
Calcium: 9.8 mg/dL (ref 8.4–10.5)
GFR, Est African American: 55 mL/min — ABNORMAL LOW
GFR, Est Non African American: 48 mL/min — ABNORMAL LOW
Glucose, Bld: 140 mg/dL — ABNORMAL HIGH (ref 70–99)
Potassium: 4.4 mEq/L (ref 3.5–5.3)
Sodium: 140 mEq/L (ref 135–145)
TOTAL PROTEIN: 7.4 g/dL (ref 6.0–8.3)

## 2014-12-18 LAB — TSH: TSH: 2.334 u[IU]/mL (ref 0.350–4.500)

## 2015-05-13 ENCOUNTER — Other Ambulatory Visit: Payer: Self-pay | Admitting: Family Medicine

## 2015-06-10 ENCOUNTER — Ambulatory Visit (INDEPENDENT_AMBULATORY_CARE_PROVIDER_SITE_OTHER): Payer: Medicare Other | Admitting: Family Medicine

## 2015-06-10 ENCOUNTER — Encounter: Payer: Self-pay | Admitting: Family Medicine

## 2015-06-10 VITALS — BP 128/76 | HR 74 | Temp 97.9°F | Resp 18 | Wt 217.2 lb

## 2015-06-10 DIAGNOSIS — I10 Essential (primary) hypertension: Secondary | ICD-10-CM

## 2015-06-10 DIAGNOSIS — E1169 Type 2 diabetes mellitus with other specified complication: Secondary | ICD-10-CM

## 2015-06-10 DIAGNOSIS — S99921A Unspecified injury of right foot, initial encounter: Secondary | ICD-10-CM | POA: Diagnosis not present

## 2015-06-10 DIAGNOSIS — E669 Obesity, unspecified: Secondary | ICD-10-CM | POA: Diagnosis not present

## 2015-06-10 DIAGNOSIS — E119 Type 2 diabetes mellitus without complications: Secondary | ICD-10-CM | POA: Diagnosis not present

## 2015-06-10 DIAGNOSIS — Z1211 Encounter for screening for malignant neoplasm of colon: Secondary | ICD-10-CM

## 2015-06-10 LAB — POCT GLYCOSYLATED HEMOGLOBIN (HGB A1C): HEMOGLOBIN A1C: 7.1

## 2015-06-10 MED ORDER — SITAGLIPTIN PHOS-METFORMIN HCL 50-1000 MG PO TABS: 1.0000 | ORAL_TABLET | Freq: Two times a day (BID) | ORAL | Status: DC

## 2015-06-10 NOTE — Progress Notes (Signed)
   Subjective:    Patient ID: Brandi Singleton, female    DOB: 1940-07-29, 74 y.o.   MRN: [id number]  HPI  Hypertension- Pt denies chest pain, SOB, dizziness, or heart palpitations.  Taking meds as directed w/o problems.  Denies medication side effects.  Brought in home BP log.    Diabetes - no hypoglycemic events. No wounds or sores that are not healing well. No increased thirst or urination. Checking glucose at home. Taking medications as prescribed without any side effects.  Her last A1c was 7.2 and we increased her metformin. She's on Lipitor 80 mg daily.  Her weight is stable.    2nd toe on right foot was injured 3 days ago. Dropped a jar of apple butter on it.  Previously fractured that toe about a year ago.  She has been able to walk on it without any significant difficulty. No medications.    Review of Systems     Objective:   Physical Exam  Constitutional: She is oriented to person, place, and time. She appears well-developed and well-nourished.  HENT:  Head: Normocephalic and atraumatic.  Cardiovascular: Normal rate, regular rhythm and normal heart sounds.   Pulmonary/Chest: Effort normal and breath sounds normal.  Musculoskeletal:  Second toe on right foot is completely bruised and swollen. She also has a small hematoma near the toenail. She is able to slightly flex the toe. Just mildly tender with compression.  Neurological: She is alert and oriented to person, place, and time.  Skin: Skin is warm and dry.  Psychiatric: She has a normal mood and affect. Her behavior is normal.          Assessment & Plan:  HTN - Home BPs well controlled.  Continue current regimen. Follow-up in 6 months.  Diabetes - hemoglobin A1c of 7.1 today. Not at goal. We'll add Januvia to her metformin. Follow-up in 3 months.. Hasn't been able to go for eye exam.   Colon cancer screening area discussed options. She declined colonoscopy. Discussed Cologuard. Form completed and faxed.   Toe  injury-offered to get an x-ray today to rule out fracture. She declined. I did show her how to buddy tape the 2 toes together and did so here in the office today. She's able to walk without significant discomfort or pain. Certainly she feels like it's not healing and getting worse then please come back in.   Flu vaccine given today.

## 2015-06-12 ENCOUNTER — Telehealth: Payer: Self-pay

## 2015-06-12 NOTE — Telephone Encounter (Signed)
Pt states she went to have medication filled and it was $40, she states she can't afford it. Please advise

## 2015-06-13 MED ORDER — GLIPIZIDE 5 MG PO TABS: 5.0000 mg | ORAL_TABLET | Freq: Two times a day (BID) | ORAL | Status: DC

## 2015-06-13 MED ORDER — METFORMIN HCL 1000 MG PO TABS: 1000.0000 mg | ORAL_TABLET | Freq: Two times a day (BID) | ORAL | Status: DC

## 2015-06-13 NOTE — Telephone Encounter (Signed)
2 new rx sent for metformin and glipizide.

## 2015-06-15 ENCOUNTER — Other Ambulatory Visit: Payer: Self-pay | Admitting: Family Medicine

## 2015-06-25 DIAGNOSIS — R197 Diarrhea, unspecified: Secondary | ICD-10-CM | POA: Diagnosis not present

## 2015-07-04 DIAGNOSIS — D72829 Elevated white blood cell count, unspecified: Secondary | ICD-10-CM | POA: Diagnosis not present

## 2015-07-04 DIAGNOSIS — I441 Atrioventricular block, second degree: Secondary | ICD-10-CM | POA: Diagnosis not present

## 2015-07-04 DIAGNOSIS — E039 Hypothyroidism, unspecified: Secondary | ICD-10-CM | POA: Diagnosis not present

## 2015-07-04 DIAGNOSIS — E669 Obesity, unspecified: Secondary | ICD-10-CM | POA: Diagnosis not present

## 2015-07-04 DIAGNOSIS — R001 Bradycardia, unspecified: Secondary | ICD-10-CM | POA: Diagnosis not present

## 2015-07-04 DIAGNOSIS — R531 Weakness: Secondary | ICD-10-CM | POA: Diagnosis not present

## 2015-07-04 DIAGNOSIS — I10 Essential (primary) hypertension: Secondary | ICD-10-CM | POA: Diagnosis not present

## 2015-07-04 DIAGNOSIS — E785 Hyperlipidemia, unspecified: Secondary | ICD-10-CM | POA: Diagnosis not present

## 2015-07-04 DIAGNOSIS — R51 Headache: Secondary | ICD-10-CM | POA: Diagnosis not present

## 2015-07-04 DIAGNOSIS — I442 Atrioventricular block, complete: Secondary | ICD-10-CM | POA: Diagnosis not present

## 2015-07-04 DIAGNOSIS — I452 Bifascicular block: Secondary | ICD-10-CM | POA: Diagnosis not present

## 2015-07-04 DIAGNOSIS — R42 Dizziness and giddiness: Secondary | ICD-10-CM | POA: Diagnosis not present

## 2015-07-04 DIAGNOSIS — R7989 Other specified abnormal findings of blood chemistry: Secondary | ICD-10-CM | POA: Diagnosis not present

## 2015-07-04 DIAGNOSIS — R55 Syncope and collapse: Secondary | ICD-10-CM | POA: Diagnosis not present

## 2015-07-04 DIAGNOSIS — E119 Type 2 diabetes mellitus without complications: Secondary | ICD-10-CM | POA: Diagnosis not present

## 2015-07-04 DIAGNOSIS — R404 Transient alteration of awareness: Secondary | ICD-10-CM | POA: Diagnosis not present

## 2015-07-05 DIAGNOSIS — Z7984 Long term (current) use of oral hypoglycemic drugs: Secondary | ICD-10-CM | POA: Diagnosis not present

## 2015-07-05 DIAGNOSIS — R55 Syncope and collapse: Secondary | ICD-10-CM | POA: Diagnosis present

## 2015-07-05 DIAGNOSIS — E119 Type 2 diabetes mellitus without complications: Secondary | ICD-10-CM | POA: Diagnosis not present

## 2015-07-05 DIAGNOSIS — Z7982 Long term (current) use of aspirin: Secondary | ICD-10-CM | POA: Diagnosis not present

## 2015-07-05 DIAGNOSIS — Z6830 Body mass index (BMI) 30.0-30.9, adult: Secondary | ICD-10-CM | POA: Diagnosis not present

## 2015-07-05 DIAGNOSIS — R001 Bradycardia, unspecified: Secondary | ICD-10-CM | POA: Diagnosis not present

## 2015-07-05 DIAGNOSIS — I441 Atrioventricular block, second degree: Secondary | ICD-10-CM | POA: Diagnosis not present

## 2015-07-05 DIAGNOSIS — E039 Hypothyroidism, unspecified: Secondary | ICD-10-CM | POA: Diagnosis present

## 2015-07-05 DIAGNOSIS — I442 Atrioventricular block, complete: Secondary | ICD-10-CM | POA: Diagnosis not present

## 2015-07-05 DIAGNOSIS — I2510 Atherosclerotic heart disease of native coronary artery without angina pectoris: Secondary | ICD-10-CM | POA: Diagnosis not present

## 2015-07-05 DIAGNOSIS — I071 Rheumatic tricuspid insufficiency: Secondary | ICD-10-CM | POA: Diagnosis not present

## 2015-07-05 DIAGNOSIS — I10 Essential (primary) hypertension: Secondary | ICD-10-CM | POA: Diagnosis not present

## 2015-07-05 DIAGNOSIS — R938 Abnormal findings on diagnostic imaging of other specified body structures: Secondary | ICD-10-CM | POA: Diagnosis not present

## 2015-07-05 DIAGNOSIS — I452 Bifascicular block: Secondary | ICD-10-CM | POA: Diagnosis not present

## 2015-07-05 DIAGNOSIS — E669 Obesity, unspecified: Secondary | ICD-10-CM | POA: Diagnosis not present

## 2015-07-05 DIAGNOSIS — Z950 Presence of cardiac pacemaker: Secondary | ICD-10-CM | POA: Diagnosis not present

## 2015-07-06 NOTE — Progress Notes (Signed)
 [facility] CARDIOLOGY PROGRESS NOTE  ASSESSMENT: Symptomatic bradycardia despite holding beta blockers HTN DM  PLAN:   Patient with continued CHB despite holding nodal blockade.  We will transfer to the CCU for IV dopamine.  She will need coronary angiography and echocardiogram prior to procedure given cardiac risk factors.  Risks, benefits, and alternatives to the plan were reviewed with the patient and the patient's daughter.  I reviewed risks of stroke, pneumothorax, lead malfunction, device infection, and others.  They are aware of these risks and agree to proceed.  We will add zofran as needed for nausea.  Hemodynamically stable on dopamine drip.   ________________________________________________________________  INTERVAL HISTORY: Brandi Singleton  The patient is mentating well, but gets dizzy when standing.  HR in the 30s.  Denies chest pain history.  PROBLEM LIST: Patient Active Problem List  Diagnosis  . Sepsis (*)  . Lower urinary tract infectious disease  . Hypertension  . Hyperlipidemia  . Hypothyroidism  . Acute respiratory failure with hypoxia (*)  . Elevated hemidiaphragm  . Hypoxia  . AV block, Mobitz 2  . Diabetes mellitus type 2 in obese (*)  . Leukocytosis  . CHB (complete heart block) (*)    MEDICATIONS:  Infusions:   . DOPamine 2.994 mcg/kg/min (07/06/15 0600)  . NaCl     Medications:  . aspirin  81 mg Oral Daily  . atorvastatin   80 mg Oral Daily 1700  . calcium  carbonate-vitamin D  1 tablet Oral BID  . enoxaparin  40 mg Subcutaneous Q24H  . fish oil  2 capsule Oral BID  . insulin aspart  2-10 Units Subcutaneous 15 min AC  . [START ON 07/07/2015] levothyroxine  sodium  50 mcg Oral Once per day on Mon Wed Thu  . levothyroxine  sodium  75 mcg Oral Once per day on Sun Tue Fri Sat  . metformin   500 mg Oral BID MEALS  . multivitamin plain  1 tablet Oral Daily  . NaCl  10 mL Intracatheter 2 times per day  . vitamin C  500 mg Oral  Daily     PHYSICAL EXAM: Vitals:   07/06/15 0700 07/06/15 0800 07/06/15 0820 07/06/15 0900  BP: 129/62 143/78 134/70 160/73  BP Location:  Right arm    Patient Position:  Lying    Pulse: 70 70 72 76  Resp: 21 22 27 24   Temp:      TempSrc:      SpO2: 95% 94% 94% 94%  Weight:      Height:       AFP:Anib mass index is 29.17 kg/(m^2). General: comfortable, no distress. Neck: Normal JVP. CV: Bradycardia Lungs: clear to auscultation Abd: soft, NT Ext: warm and dry Neuro: oriented to person/place/location   Intake/Output Summary (Last 24 hours) at 07/06/15 1009 Last data filed at 07/06/15 0839  Gross per 24 hour  Intake            525.7 ml  Output              975 ml  Net           -449.3 ml   LABS: Results for orders placed or performed during the hospital encounter of 07/04/15 (from the past 48 hour(s))  CBC And Differential   Collection Time: 07/04/15  8:47 PM  Result Value Ref Range   WBC 13.2 (H) 3.7 - 11.5 thou/mcL   RBC 4.68 4.01 - 4.90 million/mcL   HGB 14.1 12.2 - 14.9  gm/dL   HCT 58.2 64.1 - 52.0 %   MCV 89 82 - 98 fL   MCH 30.1 27.0 - 33.0 pg   MCHC 33.8 31.0 - 37.0 gm/dL   Plt Ct 792 849 - 599 thou/mcL   RDW SD 46.8 38.1 - 49.1 fL   RDW CV 14.5 11.8 - 14.9 %   MPV 11.2 8.9 - 11.2 fL   NRBC% 0.0 /100WBC   NRBC 0.000 thou/mcL   NEUTROPHIL % 65.7 50.0 - 70.0 %   LYMPHOCYTE % 26.4 25.0 - 40.0 %   MONOCYTE % 5.9 4.0 - 12.0 %   Eosinophil % 1.4 1.0 - 6.0 %   BASOPHIL % 0.6 0.0 - 2.0 %   ABSOLUTE NEUTROPHIL COUNT 8.65 (H) 1.50 - 7.50 thou/mcL   ABSOLUTE LYMPHOCYTE COUNT 3.5 1.0 - 4.5 thou/mcL   MONO ABSOLUTE 0.8 0.1 - 0.8 thou/mcL   EOS ABSOLUTE 0.2 0.0 - 0.5 thou/mcL   BASO ABSOLUTE 0.1 0.0 - 0.2 thou/mcL   IG ABSOLUTE 0.030 thou/mcL   Basic Metabolic Panel   Collection Time: 07/04/15  8:47 PM  Result Value Ref Range   Na 142 136 - 146 mmol/L   Potassium 5.3 3.7 - 5.4 mmol/L   Cl 102 97 - 108 mmol/L   CO2 25 20 - 32 mmol/L   Glucose 148 (H) 65 -  99 mg/dL   BUN 30 (H) 8 - 27 mg/dL   Creatinine 8.79 (H) 9.42 - 1.00 mg/dL   Ca 9.9 8.6 - 89.7 mg/dL   BUN/CREAT RATIO 74.9 11.0 - 26.0   GFR AFRICAN AMERICAN 52 mL/min/1.54m2   GFR Non African American 45 mL/min/1.68m2   AGAP 15 7 - 16 mmol/L  Troponin T   Collection Time: 07/04/15  8:47 PM  Result Value Ref Range   Trop T <0.010 <0.010  CK   Collection Time: 07/04/15  8:47 PM  Result Value Ref Range   CK 113 24 - 173 IU/L  Magnesium   Collection Time: 07/04/15  8:47 PM  Result Value Ref Range   Mg 1.5 (L) 1.6 - 2.6 mg/dL  Protime-INR   Collection Time: 07/04/15  8:47 PM  Result Value Ref Range   PT 10.9 9.2 - 11.6 second(s)   INR 1.04 <=4.00  PTT   Collection Time: 07/04/15  8:47 PM  Result Value Ref Range   PTT 22.1 (L) 23.0 - 30.0 second(s)  TSH   Collection Time: 07/04/15  8:47 PM  Result Value Ref Range   TSH 3.86 0.45 - 4.50 mcIU/mL  CK Total and CKMB   Collection Time: 07/04/15  8:47 PM  Result Value Ref Range   CK 114 24 - 173 IU/L   CKMB 1.65 0.00 - 5.34 ng/mL  POCT Glucose AC HS 0200   Collection Time: 07/05/15  2:09 AM  Result Value Ref Range   Glucose, POC 113 (H) 70 - 99 mg/dL  Urine Culture   Collection Time: 07/05/15  2:19 AM  Result Value Ref Range   Urine Culture (A)     >100000 CFU/mL Lactose Fermenting Gram Negative Rods  UA with Reflexive Urine Culture   Collection Time: 07/05/15  2:19 AM  Result Value Ref Range   Urine Color Yellow Yellow   Urine Appearance Clear Clear   Urine Specific Gravity 1.021 1.005 - 1.030   Urine pH 5.0 5 to 7   Urine Protein - Dipstick Negative Negative  mg/dl   Urine Glucose Negative Negative   Urine  Ketones Negative Negative mg/dl   Urine Bilirubin Negative Negative   Urine Blood Negative Negative   Urine Nitrite Positive (A) Negative   Urine Urobilinogen 0.2  0.2 , 1.0 mg/dl   Urine Leukocyte Esterase Moderate (A) Negative   Urine WBC 5-10 (A) 0-2 /hpf /HPF   Urine RBC 0-2 0 -2 /HPF /HPF   Urine  Bacteria 2+  (A) None /HPF /HPF   Urine Hyaline Casts 0-2 0-2/ lpf /LPF   UA EPITHELIALS MANUAL 0-2 0-2 /HPF  Culture, Blood (2 of 2)   Collection Time: 07/05/15  4:44 AM  Result Value Ref Range   Blood Culture No growth at 1 day   Magnesium   Collection Time: 07/05/15  4:44 AM  Result Value Ref Range   Mg 2.3 1.6 - 2.6 mg/dL  Troponin T   Collection Time: 07/05/15  4:44 AM  Result Value Ref Range   Trop T <0.010 <0.010  Lipid Panel   Collection Time: 07/05/15  4:44 AM  Result Value Ref Range   CHOLESTEROL TOTAL 159 100 - 199 mg/dL   Trig 871 0 - 850 mg/dL   HDL 35 (L) >=60 mg/dL   LDL 98 0 - 99 mg/dL   VLDL 26 5 - 40 mg/dl   CHOL/HDL 5 0 - 5  Hemoglobin A1c   Collection Time: 07/05/15  4:44 AM  Result Value Ref Range   Hgb A1C 6.7 (H) 4.8 - 5.6 %  Culture, Blood (1 of 2)   Collection Time: 07/05/15  4:45 AM  Result Value Ref Range   Blood Culture No growth at 1 day   POCT Glucose AC HS 0200   Collection Time: 07/05/15  6:26 AM  Result Value Ref Range   Glucose, POC 109 (H) 70 - 99 mg/dL  POCT Glucose AC HS 9799   Collection Time: 07/05/15 12:45 PM  Result Value Ref Range   Glucose, POC 128 (H) 70 - 99 mg/dL  POCT Glucose AC HS 9799   Collection Time: 07/05/15  5:09 PM  Result Value Ref Range   Glucose, POC 124 (H) 70 - 99 mg/dL  POCT Glucose AC HS 9799   Collection Time: 07/06/15  4:05 AM  Result Value Ref Range   Glucose, POC 65 (L) 70 - 99 mg/dL  POCT Glucose AC HS 9799   Collection Time: 07/06/15  8:37 AM  Result Value Ref Range   Glucose, POC 123 (H) 70 - 99 mg/dL      No results found. ______________________ Risks, benefits, and alternatives of the medications and treatment plan prescribed today were discussed, and patient expressed understanding. Plan follow-up as discussed or as needed if any worsening symptoms or change in condition.

## 2015-07-07 NOTE — Progress Notes (Signed)
 [facility] CARDIOLOGY PROGRESS NOTE  ASSESSMENT: Symptomatic bradycardia despite holding beta blockers HTN DM  PLAN:   Patient with continued CHB despite holding nodal blockade. HD stable on dopamine.  She will need coronary angiography and echocardiogram prior to procedure given cardiac risk factors.  Risks, benefits, and alternatives to the plan were reviewed with the patient and the patient's daughter.  I reviewed risks of stroke, pneumothorax, lead malfunction, device infection, and others.  They are aware of these risks and agree to proceed.  We will add zofran as needed for nausea.  Hemodynamically stable on dopamine drip.   ________________________________________________________________  INTERVAL HISTORY: Brandi Singleton  The patient is mentating well, but gets dizzy when standing.  HR improving but still with intermittent symptomatic bradycardia.  Denies chest pain history.  PROBLEM LIST: Patient Active Problem List  Diagnosis  . Sepsis (*)  . Lower urinary tract infectious disease  . Hypertension  . Hyperlipidemia  . Hypothyroidism  . Acute respiratory failure with hypoxia (*)  . Elevated hemidiaphragm  . Hypoxia  . AV block, Mobitz 2  . Diabetes mellitus type 2 in obese (*)  . Leukocytosis  . CHB (complete heart block) (*)    MEDICATIONS:  Infusions:   . DOPamine Stopped (07/07/15 0600)  . NaCl     Medications:  . aspirin  81 mg Oral Daily  . atorvastatin   80 mg Oral Daily 1700  . calcium  carbonate-vitamin D  1 tablet Oral BID  . enoxaparin  40 mg Subcutaneous Q24H  . fish oil  2 capsule Oral BID  . insulin aspart  2-10 Units Subcutaneous 15 min AC  . levothyroxine  sodium  50 mcg Oral Once per day on Mon Wed Thu  . levothyroxine  sodium  75 mcg Oral Once per day on Sun Tue Fri Sat  . metformin   500 mg Oral BID MEALS  . multivitamin plain  1 tablet Oral Daily  . NaCl  10 mL Intracatheter 2 times per day  . vitamin C  500 mg Oral  Daily     PHYSICAL EXAM: Vitals:   07/07/15 0600 07/07/15 0700 07/07/15 0800 07/07/15 0900  BP: 150/90 (!) 160/95 (!) 169/107 119/78  BP Location:      Patient Position:      Pulse: 66 64 78 78  Resp: 16 17 19 24   Temp:      TempSrc:      SpO2: 96% 95% 96% 96%  Weight:      Height:       AFP:Anib mass index is 29.67 kg/(m^2). General: comfortable, no distress. Neck: Normal JVP. CV: Bradycardia Lungs: clear to auscultation Abd: soft, NT Ext: warm and dry Neuro: oriented to person/place/location   Intake/Output Summary (Last 24 hours) at 07/07/15 1034 Last data filed at 07/07/15 0500  Gross per 24 hour  Intake           590.55 ml  Output             1710 ml  Net         -1119.45 ml   LABS: Results for orders placed or performed during the hospital encounter of 07/04/15 (from the past 48 hour(s))  POCT Glucose AC HS 0200   Collection Time: 07/05/15 12:45 PM  Result Value Ref Range   Glucose, POC 128 (H) 70 - 99 mg/dL  POCT Glucose AC HS 9799   Collection Time: 07/05/15  5:09 PM  Result Value Ref Range   Glucose,  POC 124 (H) 70 - 99 mg/dL  POCT Glucose AC HS 9799   Collection Time: 07/06/15  4:05 AM  Result Value Ref Range   Glucose, POC 65 (L) 70 - 99 mg/dL  POCT Glucose AC HS 9799   Collection Time: 07/06/15  8:37 AM  Result Value Ref Range   Glucose, POC 123 (H) 70 - 99 mg/dL  POCT Glucose AC HS 9799   Collection Time: 07/06/15 11:33 AM  Result Value Ref Range   Glucose, POC 106 (H) 70 - 99 mg/dL  POCT Glucose AC HS 9799   Collection Time: 07/06/15  4:00 PM  Result Value Ref Range   Glucose, POC 121 (H) 70 - 99 mg/dL  POCT Glucose AC HS 9799   Collection Time: 07/06/15 11:45 PM  Result Value Ref Range   Glucose, POC 123 (H) 70 - 99 mg/dL  POCT Glucose AC HS 9799   Collection Time: 07/07/15  8:23 AM  Result Value Ref Range   Glucose, POC 105 (H) 70 - 99 mg/dL      No results found. ______________________ Risks, benefits, and alternatives of the  medications and treatment plan prescribed today were discussed, and patient expressed understanding. Plan follow-up as discussed or as needed if any worsening symptoms or change in condition.

## 2015-07-08 NOTE — Anesthesia Preprocedure Evaluation (Addendum)
   Anesthesia Evaluation   Patient summary reviewed   No history of anesthetic complications  Airway   Dental     Pulmonary   (+) tobacco use, ex-smoker pack-years,  (-) COPD, asthma, shortness of breath, sleep apnea  Cardiovascular  (+) hypertension, dysrhythmias (Mobitz 2, symptomatic bradycardia), hyperlipidemia (-) past MI, CABG/stent, angina  ROS comment: Echo 12/24: LV EF 60-65%.  Mild TR.  RVSP est 40-34mmHg  Cath--mild nonobstructive CAD  HPI: Dizziness, bifascicular block; here for PPM   Neuro/Psych   (-) seizures, CVA   GI/Hepatic/Renal   (-) GERD   Endo/Other   (+) diabetes mellitus type 2, hypothyroidism, obesity,   Abdominal             Anesthesia Plan   ASA 2   MAC  (MAC, ASA SLM.  GA as backup.  Labs and studies reviewed. Comer Sides, MD) Anesthetic plan and risks discussed with patient.     Plan discussed with CRNA.     Date of last liquid: 07/07/15     Date of last liquid: 07/07/15

## 2015-07-08 NOTE — Consults (Signed)
 ------------------------------------------------------------------------------- Attestation signed by Charlyne FORBES Corpus, MD at 07/08/15 1308     Attending Physician attestation:   I have interviewed and examined the patient and agree with findings by Tiffany Noel,PA.  RRR without murmur CTAB NSR with bifascicular block currently  I have discussed the most relevant risks of cardiac rhythm management device implant with her.  Risks include pneumothorax necessitating chest tube placement, bleeding possibly leading to re-operation or blood transfusion, infection requiring device removal and later replacement, and cardiac or vascular perforation with potentially life-threatening complications and/or need for emergency open chest surgery.  I also mentioned the risk of respiratory suppression due to sedation.    Even if PCI is required, she'll still need a PPM prior to discharge, as bifascicular block is not expected to reliably change regardless of revascularization.  Left sided implant  Charlyne Corpus, M.D. [facility] 07/08/2015      -------------------------------------------------------------------------------  [facility] [employer] [facility]  [facility] [place] [facility] CONSULTATION   ______________________________________________________________________ Primary Care Provider:Dr. Alvan Primary Cardiologist:NONE Primary Electrophysiologist:NONE  ASSESSMENT: 1. Syncope 2. Intermittent CHB/Symptomatic Bradycardia 3. NL LV function 4. DM2 5. Hypothyroidism 6. HTN 7. HL   PLAN:   Off AV nodals > 48 hours Echo and TSH ok Cath today +/- PPM later today  The risks of this procedure including death, stroke, infection, allergic reaction, damage to heart, lungs, blood vessels, device malfunction, lead dislodgment, need for device follow up, were discussed with the patient and he wishes to proceed.  Thank you for allowing me to  participate in this patient's care. Will follow along with you.   HISTORY: Brandi Singleton is a 74 y.o. female who is being seen for the evaluation of heart block. She has a past medical history of hypertension, hyperlipidemia, diabetes mellitus, hypothyroidism and obesity but no history of coronary disease and presents with syncope on 12/123/16. Patient found herself on the floor lying on her left arm at about 7 PM. She felt very dizzy and her head was spinning. She complained of numbness and tingling of both hands associated with nausea and multiple episodes of vomiting. The last thing she remembered was she had fed her dog and had returned back to her house. She denied any preceding symptoms of chest pain, palpitation, skipped beat or shortness of breath. She was unable to stand due to her dizziness and crawled on the floor to make a phone call to his son. 911 was called and patient was brought to the emergency department.  In the ED on telemetry patient was found to have bradycardia as low as 40 beats a minute with intermittent AV block. CT scan of the head revealed no acute abnormality. She was admitted by CARDS and BB held. TSDH normal. Echo normal. She continued with intermittent CHB through the weekend. Hemodynamically she has been stable.     ROS:  Dermatological ROS: negative Ophthalmic ROS: negative ENT ROS: negative Respiratory ROS: no cough, no shortness of breath, or wheezing Cardiovascular ROS:no chest pain or dyspnea on exertion Gastrointestinal ROS: no abdominal pain, change in bowel habits, or black or bloody stools Genito-Urinary ROS: no dysuria, trouble voiding, or hematuria Hematological and Lymphatic ROS: negative Endocrine ROS: negative Neurological ROS: no TIA or stroke symptoms Musculoskeletal ROS: negative Psychological ROS: negative   PMH / PROBLEM LIST: Patient Active Problem List  Diagnosis  . Sepsis (*)  . Lower urinary tract infectious disease  . Hypertension   . Hyperlipidemia  . Hypothyroidism  . Acute respiratory  failure with hypoxia (*)  . Elevated hemidiaphragm  . Hypoxia  . AV block, Mobitz 2  . Diabetes mellitus type 2 in obese (*)  . Leukocytosis  . CHB (complete heart block) (*)    SURGICAL HISTORY: Past Surgical History  Procedure Laterality Date  . Hysterectomy       Medications:  . aspirin  325 mg Oral Daily  . atorvastatin   80 mg Oral Daily 1700  . calcium  carbonate-vitamin D  1 tablet Oral BID  . fish oil  2 capsule Oral BID  . levothyroxine  sodium  50 mcg Oral Once per day on Mon Wed Thu  . levothyroxine  sodium  75 mcg Oral Once per day on Sun Tue Fri Sat  . multivitamin plain  1 tablet Oral Daily  . NaCl  10 mL Intracatheter 2 times per day  . vitamin C  500 mg Oral Daily    FAMILY HISTORY: Family History  Problem Relation Age of Onset  . Heart disease Mother   . Heart failure Father     SOCIAL HISTORY: Social History   Social History  . Marital status: Married    Spouse name: N/A  . Number of children: N/A  . Years of education: N/A   Occupational History  . Not on file.   Social History Main Topics  . Smoking status: Former Games developer  . Smokeless tobacco: Not on file  . Alcohol use No  . Drug use: No  . Sexual activity: Not on file   Other Topics Concern  . Not on file   Social History Narrative    TOBACCO USE:  History  Smoking Status  . Former Smoker  Smokeless Tobacco  . Not on file    PHYSICAL EXAM: Vitals:   07/08/15 0300 07/08/15 0400 07/08/15 0500 07/08/15 0600  BP: 173/89 119/77 132/70 101/66  BP Location: Right arm Right arm Right arm Right arm  Patient Position: Lying Lying Lying Lying  Pulse: 86 80 66 78  Resp: 16 18 17 17   Temp:  97.7 F (36.5 C)    TempSrc:  Oral    SpO2: 100% 96% 95% 96%  Weight:      Height:       BMI: Body mass index is 29.67 kg/(m^2). General: comfortable, no distress, well-appearing white female HEENT: Pupils equal, normal extraocular  movements Neck: no LAD, no jugular venous pulsation, no bruits CV: IRRR , no murmur; no peripheral edema; Lungs: CTAB,  no rales or wheezes Abd: soft, NT, normal BS; normal aortic pulsation with no bruits, no organomegaly Ext: 2+ DP, PT, femoral and radial pulses bilaterally;  no clubbing or cyanosis; no ulcers. Skin: no ulcers or stasis dermatitis noted. No tendon xanthomas. Neuro: Oriented to time/person/place, grossly normal motor strength    Intake/Output Summary (Last 24 hours) at 07/08/15 0856 Last data filed at 07/08/15 0401  Gross per 24 hour  Intake              260 ml  Output              350 ml  Net              -90 ml    Echocardiogram 2d Complete  Result Date: 07/05/2015  [facility]                                             [address]  +-----------------+ +------------------------------------+     Wilhelmina Rakers:                 : :                                    BETHA Daniel [place],:                 : :                                    :       [place] [id number]   :                 : :                                    :    [phone]:                 : :                                    :    Fax [phone]                 : +------------------------------------+         [phone]     :                 :                                                         +-----------------+                                           Echocardiogram                                               Report +--------------------------------------------------------------------------+ :Name: Brandi HANDING Singleton                 Study Date: 07/05/2015 09:04 AM  : :Patient Location: [facility] CPPU^[id number]   BP: 117/54 mmHg                  : :MRN: [id number]                                                             : :  DOB: 1941-02-26                          Height: 70 in                    : :Gender: Female                            Weight: 210 lb                   : :Race: White or Caucasian                                                  : :Age: 53 yrs                              BSA: 2.1 m2                      : :Reason For Study: Heart Block                                             : :History: AV Block, Hypertension, Hyperlipdiemia, Respiratory Failure,     : :Former Smoker, Diabetes,Hypothyroidism                                    : :Referring Physician: Alvan Craven                                  : :Performed By: Calton Pouch, RDCS                                    : +--------------------------------------------------------------------------+ Interpretation Summary A complete portable two-dimensional transthoracic echocardiogram was performed. The left ventricle is normal in size. There is no thrombus. There is normal left ventricular wall thickness. The left ventricular ejection fraction is normal (60-65%). The left ventricular wall motion is normal. The interatrial septum is intact with no evidence for an atrial septal defect. The aortic valve is trileaflet with thin, pliable leaflets that move normally. There is mild (1+) tricuspid regurgitation. Right ventricular systolic pressure is elevated between 40-58mm Hg, consistent with moderate pulmonary hypertension. Left Ventricle The left ventricle is normal in size. There is no thrombus. There is normal left ventricular wall thickness. The left ventricular ejection fraction is normal (60-65%). The left ventricular wall motion is normal. Right Ventricle The right ventricle is grossly normal in size and function. Atria The left atrium is normal size. The interatrial septum is intact with no evidence for an atrial septal defect. The right atrium is normal. Mitral Valve The mitral valve leaflets appear normal. There is no evidence of stenosis, fluttering, or prolapse. Tricuspid Valve The tricuspid valve leaflets are thin and pliable and the valve  motion is normal. There is mild (1+) tricuspid regurgitation.Right ventricular systolic pressure is elevated between 40-90mm  Hg, consistent with moderate pulmonary hypertension. Aortic Valve The aortic valve is trileaflet with thin, pliable leaflets that move normally. Pulmonic Valve The pulmonic valve leaflets are thin and pliable; valve motion is normal. Vessels The aortic root is normal. The pulmonary artery is normal. Pericardium There is no pericardial effusion. MMode/2D Measurements & Calculations IVSd: 1.1 cm                                LVIDd: 5.2 cm                                             LVIDs: 3.6 cm                                             LVPWd: 1.1 cm         _____________________________________________________________ LV mass(C)d: 223.4 grams                    Ao root diam: 3.4 cm LV mass(C)dI: 104.8 grams/m2                Ao root area: 9.0 cm2                                             LA dimension: 3.5 cm         _____________________________________________________________ LAV (MOD-bp) Index: 17.4 ml/m2              LAV(MOD-bp): 37.0 ml Doppler Measurements & Calculations MV E max vel: 96.7 cm/sec               MV dec time: 0.27 sec         _____________________________________________________________ TR max vel: 280.0 cm/sec                RAP systole: 10.0 mmHg TR max PG: 31.4 mmHg RVSP(TR): 41.4 mmHg ____________________________________________________________________________ Electronically signed ab:Fpryjzo Drucker, M.D.   on   07/05/2015 11:23 AM Ordering Physician: [phone]^PHILIP^SHIBU^^^^^^EPI  -Viewed  EKG-Viewed Ecg 12 Lead  Result Date: 07/06/2015 Diagnosis Class Abnormal Acquisition Device MAC Ventricular Rate 74 Atrial Rate 74 P-R Interval 225 QRS Duration 167 Q-T Interval 417 QTC Calculation(Bezet) 463 Calculated P Axis 44 Calculated R Axis -57 Calculated T Axis 67 Diagnosis Normal sinus rhythm with 1st degree AV block Right bundle branch block Left anterior  fascicular block ### Bifascicular block ### Left ventricular hypertrophy with QRS widening Abnormal ECG When compared with ECG of 05-Jul-2015 13:14, Significant changes have occurred DRUCKER MD, MICHAEL ([id number]) on 07/06/2015 11:43:01 AM certifies that he/she has reviewed the ECG tracing and confirms the  independent interpretation is correct.   LABS: Results for orders placed or performed during the hospital encounter of 07/04/15 (from the past 48 hour(s))  POCT Glucose AC HS 0200   Collection Time: 07/06/15 11:33 AM  Result Value Ref Range   Glucose, POC 106 (H) 70 - 99 mg/dL  POCT Glucose AC HS 9799   Collection Time: 07/06/15  4:00 PM  Result Value Ref Range   Glucose, POC 121 (H) 70 - 99  mg/dL  POCT Glucose AC HS 9799   Collection Time: 07/06/15 11:45 PM  Result Value Ref Range   Glucose, POC 123 (H) 70 - 99 mg/dL  POCT Glucose AC HS 9799   Collection Time: 07/07/15  8:23 AM  Result Value Ref Range   Glucose, POC 105 (H) 70 - 99 mg/dL  POCT Glucose AC HS 9799   Collection Time: 07/07/15 12:18 PM  Result Value Ref Range   Glucose, POC 94 70 - 99 mg/dL  POCT Glucose AC HS 9799   Collection Time: 07/07/15  5:24 PM  Result Value Ref Range   Glucose, POC 153 (H) 70 - 99 mg/dL  POCT Glucose AC HS 9799   Collection Time: 07/07/15  8:38 PM  Result Value Ref Range   Glucose, POC 109 (H) 70 - 99 mg/dL  POCT Glucose AC HS 9799   Collection Time: 07/08/15  4:16 AM  Result Value Ref Range   Glucose, POC 121 (H) 70 - 99 mg/dL  CBC   Collection Time: 07/08/15  6:43 AM  Result Value Ref Range   WBC 11.8 (H) 3.7 - 11.5 thou/mcL   RBC 5.21 (H) 4.01 - 4.90 million/mcL   HGB 15.7 (H) 12.2 - 14.9 gm/dL   HCT 52.7 64.1 - 52.0 %   MCV 91 82 - 98 fL   MCH 30.1 27.0 - 33.0 pg   MCHC 33.3 31.0 - 37.0 gm/dL   Plt Ct 818 849 - 599 thou/mcL   RDW SD 47.3 38.1 - 49.1 fL   RDW CV 14.3 11.8 - 14.9 %   MPV 11.7 (H) 8.9 - 11.2 fL  Basic metabolic panel   Collection Time: 07/08/15  6:43 AM   Result Value Ref Range   Na 140 136 - 146 mmol/L   Potassium 4.2 3.7 - 5.4 mmol/L   Cl 97 97 - 108 mmol/L   CO2 26 20 - 32 mmol/L   Glucose 110 (H) 65 - 99 mg/dL   BUN 18 8 - 27 mg/dL   Creatinine 8.99 9.42 - 1.00 mg/dL   Ca 9.5 8.6 - 89.7 mg/dL   BUN/CREAT RATIO 81.9 11.0 - 26.0   GFR AFRICAN AMERICAN 65 mL/min/1.78m2   GFR Non African American 56 mL/min/1.54m2   AGAP 17 (H) 7 - 16 mmol/L    Total time spent with patient was 32 minutes of which more than 50% of the total face-to-face time was spent counseling and/or coordination of care regarding diagnosis and upcoming cardiac procedures.  07/08/2015 / 8:56 AM

## 2015-07-08 NOTE — Care Plan (Signed)
 Problem: Fall Prevention Goal: Absence of falls Outcome: Progressing Pt has remained free of falls throughout the shift. Bed in lowest position, brakes locked, call bell within reach.  Problem: Tissue Perfusion - Cardiopulmonary, Altered Goal: Blood pressure within specified parameters Outcome: Progressing BP WNL. Goal: Absence of angina Outcome: Progressing Pt has had no c/o chest pain throughout the shift.

## 2015-07-09 NOTE — Nursing Note (Signed)
 AVS, Rxs, and post PPM discharge instructions reviewed and given to pt and pt's family with verbalized understanding.  Pt dressed and with belongings packed for d/c home.  Pt denies further needs at this time.  Pt waiting for w/c escort for d/c home.  Daughter to drive pt home.

## 2015-07-11 ENCOUNTER — Other Ambulatory Visit: Payer: Self-pay | Admitting: Family Medicine

## 2015-07-12 ENCOUNTER — Other Ambulatory Visit: Payer: Self-pay | Admitting: Family Medicine

## 2015-07-15 ENCOUNTER — Telehealth: Payer: Self-pay

## 2015-07-15 NOTE — Telephone Encounter (Signed)
Patient advised. No blood in stool.

## 2015-07-15 NOTE — Telephone Encounter (Signed)
Try cutting the metformin in half. Take half a tab twice a day and see if this seems to help with the diarrhea. If not then placed let me know. Also please verify if the stool has been bloody.

## 2015-07-15 NOTE — Telephone Encounter (Signed)
Patient states she has had diarrhea for 1 week. She denies fever, chills, sweats or abdominal pain. She is not sure if it is the metformin or if it is bacterial or viral. She cannot come in this week because of transportation. She wants to know if she could give a stool sample.

## 2015-07-29 ENCOUNTER — Telehealth: Payer: Self-pay | Admitting: Family Medicine

## 2015-07-29 NOTE — Telephone Encounter (Signed)
Spoke with Pt advising her Cologuard kit needs to be completed and returned in 30 days or the kit will be cancelled. Pt states she was in the hospital and had a pacemaker implanted. Pt will try to complete the kit within 30 days.

## 2015-07-31 ENCOUNTER — Encounter: Payer: Self-pay | Admitting: Family Medicine

## 2015-07-31 ENCOUNTER — Ambulatory Visit (INDEPENDENT_AMBULATORY_CARE_PROVIDER_SITE_OTHER): Payer: Medicare Other

## 2015-07-31 ENCOUNTER — Ambulatory Visit (INDEPENDENT_AMBULATORY_CARE_PROVIDER_SITE_OTHER): Payer: Medicare Other | Admitting: Family Medicine

## 2015-07-31 VITALS — BP 137/69 | HR 93 | Ht 70.0 in | Wt 205.0 lb

## 2015-07-31 DIAGNOSIS — R42 Dizziness and giddiness: Secondary | ICD-10-CM

## 2015-07-31 DIAGNOSIS — W1809XA Striking against other object with subsequent fall, initial encounter: Secondary | ICD-10-CM | POA: Diagnosis not present

## 2015-07-31 DIAGNOSIS — X58XXXA Exposure to other specified factors, initial encounter: Secondary | ICD-10-CM

## 2015-07-31 DIAGNOSIS — R0781 Pleurodynia: Secondary | ICD-10-CM

## 2015-07-31 DIAGNOSIS — S2241XA Multiple fractures of ribs, right side, initial encounter for closed fracture: Secondary | ICD-10-CM

## 2015-07-31 DIAGNOSIS — S298XXA Other specified injuries of thorax, initial encounter: Secondary | ICD-10-CM

## 2015-07-31 NOTE — Progress Notes (Signed)
   Subjective:    Patient ID: Brandi Singleton, female    DOB: 1941-05-08, 75 y.o.   MRN: [id number]  HPI Injury on 07/28/15.  It was around 10 PM in the evening. She feel backwards getting her rob off the hook in the bathroom. She doesn't remember feeling lightheaded or dizzy. She fell backwards and hit her right posterior rib area on the bathtub and hit her left hip on the toilet. She was sort of wedged in between those. She doesn't think she lost consciousness. The area particularly on the right side has remained sore and she came in today to have it evaluated and make sure that she didn't fracture any ribs. In fact she recently got a pacemaker in December for having a few dizzy and syncopal episodes. She saw her cardiologist yesterday he did make some adjustments to her medication and they did do a download for her pacemaker but have not heard back yet.   Review of Systems     Objective:   Physical Exam  Constitutional: She is oriented to person, place, and time. She appears well-developed and well-nourished.  HENT:  Head: Normocephalic and atraumatic.  Cardiovascular: Normal rate, regular rhythm and normal heart sounds.   Pulmonary/Chest: Effort normal and breath sounds normal.  Musculoskeletal:  She has a bruise over the right flank just below the ribs. But she is tender over the last 3 ribs posteriorly. Nontender over the lumbar spine. She also has a bruise over her left hip. Only mildly tender.  Neurological: She is alert and oriented to person, place, and time.  Skin: Skin is warm and dry.  Psychiatric: She has a normal mood and affect. Her behavior is normal.          Assessment & Plan:  Blunt trauma to the ribs-she's very tender over the last 3 ribs posteriorly. The bruising is actually a little lower than this. And for x-rays for further evaluation. They did confirm fracture of the ninth, 10th, and 11th ribs. They are nondisplaced which is reassuring. We will contact patient  about information. Make sure that she has medication to use for pain and discomfort. He can take up to 12 weeks for a rib to heal.  Contusion - gave reassurance.  Dizziness-encouraged her to schedule follow-up so that we can investigate this further. She still feeling a little dizzy especially when she goes from sitting to standing and when she lays down at night she'll feel dizzy for just a few seconds. She could have positional vertigo versus some mild orthostasis. Also see if this improves after her recent medication adjustments.

## 2015-08-11 ENCOUNTER — Ambulatory Visit (INDEPENDENT_AMBULATORY_CARE_PROVIDER_SITE_OTHER): Payer: Medicare Other | Admitting: Family Medicine

## 2015-08-11 ENCOUNTER — Telehealth: Payer: Self-pay | Admitting: Family Medicine

## 2015-08-11 ENCOUNTER — Encounter: Payer: Self-pay | Admitting: Family Medicine

## 2015-08-11 VITALS — BP 147/69 | HR 79 | Wt 206.0 lb

## 2015-08-11 DIAGNOSIS — R208 Other disturbances of skin sensation: Secondary | ICD-10-CM | POA: Diagnosis not present

## 2015-08-11 DIAGNOSIS — R200 Anesthesia of skin: Secondary | ICD-10-CM

## 2015-08-11 DIAGNOSIS — H5509 Other forms of nystagmus: Secondary | ICD-10-CM | POA: Diagnosis not present

## 2015-08-11 DIAGNOSIS — R42 Dizziness and giddiness: Secondary | ICD-10-CM

## 2015-08-11 NOTE — Telephone Encounter (Signed)
Call pt and see if she knows if she has teh kind of pacemaker that can have an MRI. If not then we will need to order Ct with contrast.

## 2015-08-11 NOTE — Progress Notes (Signed)
   Subjective:    Patient ID: Brandi Singleton, female    DOB: 1940-09-30, 75 y.o.   MRN: [id number]  HPI Says she feels dizzy when she lays down at night. Will feel ike the bed it tilting.   Says will feel dizzy when she starts to stand up from sitting.  Says last a minute or two.  Notices it the last few weeks.  Thought it would be better once she had her pacemaker in.  Says it is less than before she got hre pacemaker.  She did hit her head on the back on 12/23.  Says it has been very tender.    The complains of some numbness near her left ankle on the outer portion that starts from the mid calf down towards the top of the foot. She denies any injury or trauma. She says it just feels like a decreased sensation. She has not noticed any difficulty with flexion or extension of the ankle. No weakness. But she does feel like she is dragging that foot more.  Review of Systems     Objective:   Physical Exam  Constitutional: She is oriented to person, place, and time. She appears well-developed and well-nourished.  HENT:  Head: Normocephalic and atraumatic.  Right Ear: External ear normal.  Left Ear: External ear normal.  Nose: Nose normal.  Mouth/Throat: Oropharynx is clear and moist.  TMs and canals are clear.   Eyes: Conjunctivae and EOM are normal. Pupils are equal, round, and reactive to light.  Neck: Neck supple. No thyromegaly present.  Cardiovascular: Normal rate, regular rhythm and normal heart sounds.   Pulmonary/Chest: Effort normal and breath sounds normal. She has no wheezes.  Musculoskeletal:  Had vertical nystagmus with Diks-hallpike manuver bilaterally.    Lymphadenopathy:    She has no cervical adenopathy.  Neurological: She is alert and oriented to person, place, and time.  Skin: Skin is warm and dry.  Psychiatric: She has a normal mood and affect.          Assessment & Plan:  Vertigo with for vertical nystagmus-discussed further workup with imaging. She did have a  CT scan done about a month ago after hitting her head. It was negative. I would recommend MRI for further evaluation but I'll need to find out if her pacemaker is the time that she can have an MRI with. If not we may need CT with contrast. Will need to better evaluate the brainstem.  Vertigo-given handout on exercises to do for BPV. Cup further since she has vertical nystagmus.  Numbness over the left outer portion of the ankle. In distribution of the superficial peroneal nerve, L4-S1. For now we'll focus on the vertigo and may need to address issues possibly with lumbar spine at next office visit.

## 2015-08-18 NOTE — Telephone Encounter (Signed)
Called pt and she informed me that she [employer] pacemaker and according to her booklet it states that she can have this done. I advised her that should she have the MRI done she should bring this information with her.Brandi Singleton, Lahoma Crocker

## 2015-08-21 DIAGNOSIS — I442 Atrioventricular block, complete: Secondary | ICD-10-CM | POA: Diagnosis not present

## 2015-08-21 DIAGNOSIS — Z45018 Encounter for adjustment and management of other part of cardiac pacemaker: Secondary | ICD-10-CM | POA: Diagnosis not present

## 2015-08-29 ENCOUNTER — Telehealth: Payer: Self-pay | Admitting: Family Medicine

## 2015-08-29 NOTE — Telephone Encounter (Signed)
I called pt to inform her to bring in her Cologuard kit and she wanted to let you know it has expired. She has been in the hospital getting a pacemaker

## 2015-08-29 NOTE — Telephone Encounter (Signed)
We can address later then.

## 2015-09-02 ENCOUNTER — Encounter (HOSPITAL_COMMUNITY): Payer: Self-pay

## 2015-09-03 ENCOUNTER — Ambulatory Visit (HOSPITAL_COMMUNITY)
Admission: RE | Admit: 2015-09-03 | Discharge: 2015-09-03 | Disposition: A | Discharge status: Home / Self Care | Payer: Medicare Other | Source: Ambulatory Visit | Attending: Family Medicine | Admitting: Family Medicine

## 2015-09-08 ENCOUNTER — Ambulatory Visit: Payer: Medicare Other | Admitting: Family Medicine

## 2015-09-08 DIAGNOSIS — I441 Atrioventricular block, second degree: Secondary | ICD-10-CM | POA: Diagnosis not present

## 2015-09-08 DIAGNOSIS — I10 Essential (primary) hypertension: Secondary | ICD-10-CM | POA: Diagnosis not present

## 2015-09-08 DIAGNOSIS — I442 Atrioventricular block, complete: Secondary | ICD-10-CM | POA: Diagnosis not present

## 2015-09-19 ENCOUNTER — Ambulatory Visit (HOSPITAL_COMMUNITY)
Admission: RE | Admit: 2015-09-19 | Discharge: 2015-09-19 | Disposition: A | Discharge status: Home / Self Care | Payer: Medicare Other | Source: Ambulatory Visit | Attending: Family Medicine | Admitting: Family Medicine

## 2015-09-19 DIAGNOSIS — H5509 Other forms of nystagmus: Secondary | ICD-10-CM | POA: Diagnosis not present

## 2015-09-19 DIAGNOSIS — R42 Dizziness and giddiness: Secondary | ICD-10-CM | POA: Diagnosis not present

## 2015-09-19 DIAGNOSIS — Z8673 Personal history of transient ischemic attack (TIA), and cerebral infarction without residual deficits: Secondary | ICD-10-CM

## 2015-09-20 DIAGNOSIS — Z8673 Personal history of transient ischemic attack (TIA), and cerebral infarction without residual deficits: Secondary | ICD-10-CM | POA: Insufficient documentation

## 2015-09-24 ENCOUNTER — Ambulatory Visit (INDEPENDENT_AMBULATORY_CARE_PROVIDER_SITE_OTHER): Payer: Medicare Other | Admitting: Family Medicine

## 2015-09-24 ENCOUNTER — Encounter: Payer: Self-pay | Admitting: Family Medicine

## 2015-09-24 VITALS — BP 132/84 | HR 72 | Wt 204.0 lb

## 2015-09-24 DIAGNOSIS — K760 Fatty (change of) liver, not elsewhere classified: Secondary | ICD-10-CM

## 2015-09-24 DIAGNOSIS — R42 Dizziness and giddiness: Secondary | ICD-10-CM | POA: Diagnosis not present

## 2015-09-24 DIAGNOSIS — E1165 Type 2 diabetes mellitus with hyperglycemia: Secondary | ICD-10-CM | POA: Diagnosis not present

## 2015-09-24 DIAGNOSIS — I10 Essential (primary) hypertension: Secondary | ICD-10-CM

## 2015-09-24 DIAGNOSIS — F32A Depression, unspecified: Secondary | ICD-10-CM

## 2015-09-24 DIAGNOSIS — F329 Major depressive disorder, single episode, unspecified: Secondary | ICD-10-CM

## 2015-09-24 DIAGNOSIS — E7800 Pure hypercholesterolemia, unspecified: Secondary | ICD-10-CM

## 2015-09-24 DIAGNOSIS — IMO0001 Reserved for inherently not codable concepts without codable children: Secondary | ICD-10-CM

## 2015-09-24 LAB — POCT GLYCOSYLATED HEMOGLOBIN (HGB A1C): HEMOGLOBIN A1C: 5.6

## 2015-09-24 MED ORDER — ESCITALOPRAM OXALATE 10 MG PO TABS: 10.0000 mg | ORAL_TABLET | Freq: Every day | ORAL | Status: DC

## 2015-09-24 NOTE — Progress Notes (Signed)
   Subjective:    Patient ID: Brandi Singleton, female    DOB: 06/23/1941, 75 y.o.   MRN: [id number]  HPI Her for her vertigo and dizziness.  She is feeling some better. Says she is more than 25% better. Did go for her MRI and is here to go over her results.  She did have a head injury in December when she fell.   Diabetes - no hypoglycemic events. No wounds or sores that are not healing well. No increased thirst or urination. Checking glucose at home. Taking medications as prescribed without any side effects.  Has been stressed adn overwhlemed.  Her husband with whom she was separated suddenly died a few weeks ago. She's had a lot of other stressors at home as well. she has been irritable.   Hypertension- Pt denies chest pain, SOB, dizziness, or heart palpitations.  Taking meds as directed w/o problems.  Denies medication side effects.     Review of Systems     Objective:   Physical Exam  Constitutional: She is oriented to person, place, and time. She appears well-developed and well-nourished.  HENT:  Head: Normocephalic and atraumatic.  Right Ear: External ear normal.  Left Ear: External ear normal.  Nose: Nose normal.  Mouth/Throat: Oropharynx is clear and moist.  TMs and canals are clear.   Eyes: Conjunctivae and EOM are normal. Pupils are equal, round, and reactive to light.  Neck: Neck supple. No thyromegaly present.  Cardiovascular: Normal rate, regular rhythm and normal heart sounds.   Pulmonary/Chest: Effort normal and breath sounds normal. She has no wheezes.  Lymphadenopathy:    She has no cervical adenopathy.  Neurological: She is alert and oriented to person, place, and time.  Skin: Skin is warm and dry.  Psychiatric: She has a normal mood and affect. Her behavior is normal.        Assessment & Plan:  Vertigo with nystagmus - reviewed MRI results which showed a chronic infarct in the cerebellum which could be causing her sxs .  She is getting better. Offered formal  PT. She declines at this time.    DM- well controlled. F/U in 3 months.   Lab Results  Component Value Date   HGBA1C 5.6 09/24/2015    HTN - well controlled.  Continue current regimen.    Depression - Discussed options. She declines therapy or counseling.  Offered medication as tool as wel. She has never taken meds like this before. Will start with Lexapro.  F/U in 1 mo.

## 2015-10-06 DIAGNOSIS — K760 Fatty (change of) liver, not elsewhere classified: Secondary | ICD-10-CM | POA: Diagnosis not present

## 2015-10-06 DIAGNOSIS — E1165 Type 2 diabetes mellitus with hyperglycemia: Secondary | ICD-10-CM | POA: Diagnosis not present

## 2015-10-06 DIAGNOSIS — E119 Type 2 diabetes mellitus without complications: Secondary | ICD-10-CM | POA: Diagnosis not present

## 2015-10-06 DIAGNOSIS — E7800 Pure hypercholesterolemia, unspecified: Secondary | ICD-10-CM | POA: Diagnosis not present

## 2015-10-06 DIAGNOSIS — I10 Essential (primary) hypertension: Secondary | ICD-10-CM | POA: Diagnosis not present

## 2015-10-07 LAB — COMPLETE METABOLIC PANEL WITH GFR
ALBUMIN: 4.3 g/dL (ref 3.6–5.1)
ALT: 43 U/L — ABNORMAL HIGH (ref 6–29)
AST: 40 U/L — AB (ref 10–35)
Alkaline Phosphatase: 87 U/L (ref 33–130)
BILIRUBIN TOTAL: 0.7 mg/dL (ref 0.2–1.2)
BUN: 19 mg/dL (ref 7–25)
CALCIUM: 9.7 mg/dL (ref 8.6–10.4)
CO2: 27 mmol/L (ref 20–31)
CREATININE: 0.94 mg/dL — AB (ref 0.60–0.93)
Chloride: 104 mmol/L (ref 98–110)
GFR, EST AFRICAN AMERICAN: 70 mL/min (ref >=60)
GFR, Est Non African American: 60 mL/min (ref >=60)
Glucose, Bld: 100 mg/dL — ABNORMAL HIGH (ref 65–99)
POTASSIUM: 4.9 mmol/L (ref 3.5–5.3)
Sodium: 141 mmol/L (ref 135–146)
Total Protein: 7.7 g/dL (ref 6.1–8.1)

## 2015-10-07 LAB — HEPATIC FUNCTION PANEL
ALBUMIN: 4.3 g/dL (ref 3.6–5.1)
ALT: 43 U/L — AB (ref 6–29)
AST: 40 U/L — ABNORMAL HIGH (ref 10–35)
Alkaline Phosphatase: 87 U/L (ref 33–130)
BILIRUBIN INDIRECT: 0.5 mg/dL (ref 0.2–1.2)
Bilirubin, Direct: 0.2 mg/dL (ref <=0.2)
TOTAL PROTEIN: 7.7 g/dL (ref 6.1–8.1)
Total Bilirubin: 0.7 mg/dL (ref 0.2–1.2)

## 2015-10-07 LAB — LIPID PANEL
Cholesterol: 149 mg/dL (ref 125–200)
HDL: 38 mg/dL — ABNORMAL LOW (ref >=46)
LDL CALC: 88 mg/dL (ref <130)
TRIGLYCERIDES: 113 mg/dL (ref <150)
Total CHOL/HDL Ratio: 3.9 Ratio (ref <=5.0)
VLDL: 23 mg/dL (ref <30)

## 2015-10-14 ENCOUNTER — Other Ambulatory Visit: Payer: Self-pay | Admitting: Family Medicine

## 2015-10-22 ENCOUNTER — Encounter: Payer: Self-pay | Admitting: Family Medicine

## 2015-10-22 ENCOUNTER — Ambulatory Visit (INDEPENDENT_AMBULATORY_CARE_PROVIDER_SITE_OTHER): Payer: Medicare Other | Admitting: Family Medicine

## 2015-10-22 VITALS — BP 161/71 | HR 62 | Wt 198.0 lb

## 2015-10-22 DIAGNOSIS — H8110 Benign paroxysmal vertigo, unspecified ear: Secondary | ICD-10-CM | POA: Diagnosis not present

## 2015-10-22 DIAGNOSIS — F4321 Adjustment disorder with depressed mood: Secondary | ICD-10-CM

## 2015-10-22 NOTE — Progress Notes (Signed)
   Subjective:    Patient ID: Brandi Singleton, female    DOB: January 01, 1941, 75 y.o.   MRN: [id number]  HPI Grief reaction- phq-9=5 not difficult gad-7=10 not difficult dealing w/the loss of her husband he passed in feb a lot of legal issues.  She still struggling a lot with dealing with a lot of financial repercussions of her husband's death. And there are also some medical bills that are coming and that are concerning for her. She says that her daughter has been very supportive and has been helping her through it. They are actually going to his home this weekend in [place] to try to sell his home. She did start the Lexapro. She says she's not really sure if it's helping or not. She denies any side effects from the medication.  Vertigo-she says overall her vertigo is much better. She is actually moved back into her apartment. She was staying with her daughter for a while because it was so intense. She said she'll get very brief episodes now but they seem to resolve very quickly and she is happy with that. We had offered to refer her to therapy if it persisted.    Review of Systems     Objective:   Physical Exam  Constitutional: She is oriented to person, place, and time. She appears well-developed and well-nourished.  HENT:  Head: Normocephalic and atraumatic.  Cardiovascular: Normal rate, regular rhythm and normal heart sounds.   Pulmonary/Chest: Effort normal and breath sounds normal.  Neurological: She is alert and oriented to person, place, and time.  Skin: Skin is warm and dry.  Psychiatric: She has a normal mood and affect. Her behavior is normal.          Assessment & Plan:  Grief reaction-discussed with her options. We could certainly look at discontinuing medication if she's not sure that it's effective. She says for now should like to stay on it. Follow-up in 2 months and we'll reassess at that time. Call if any problems or concerns in between. GAD 7 score of 10 today she  rates her symptoms as not difficult. Depression questionnaire, PHQ 9 score of 5. She denies any thoughts of wanting to harm herself..  Vertigo-improved but not completely resolved. Continue to work on home exercises.

## 2015-11-19 DIAGNOSIS — E113293 Type 2 diabetes mellitus with mild nonproliferative diabetic retinopathy without macular edema, bilateral: Secondary | ICD-10-CM | POA: Diagnosis not present

## 2015-11-19 DIAGNOSIS — H25093 Other age-related incipient cataract, bilateral: Secondary | ICD-10-CM | POA: Diagnosis not present

## 2015-11-19 LAB — HM DIABETES EYE EXAM

## 2015-11-23 ENCOUNTER — Other Ambulatory Visit: Payer: Self-pay | Admitting: Family Medicine

## 2015-11-24 ENCOUNTER — Encounter: Payer: Self-pay | Admitting: Family Medicine

## 2015-11-24 DIAGNOSIS — Z950 Presence of cardiac pacemaker: Secondary | ICD-10-CM | POA: Diagnosis not present

## 2015-11-24 DIAGNOSIS — I442 Atrioventricular block, complete: Secondary | ICD-10-CM | POA: Diagnosis not present

## 2015-12-22 ENCOUNTER — Ambulatory Visit: Payer: Medicare Other | Admitting: Family Medicine

## 2015-12-22 ENCOUNTER — Other Ambulatory Visit: Payer: Self-pay | Admitting: *Deleted

## 2015-12-22 MED ORDER — GLIPIZIDE 5 MG PO TABS: 5.0000 mg | ORAL_TABLET | Freq: Every day | ORAL | Status: DC

## 2015-12-22 MED ORDER — ESCITALOPRAM OXALATE 10 MG PO TABS: 10.0000 mg | ORAL_TABLET | Freq: Every day | ORAL | Status: DC

## 2015-12-26 ENCOUNTER — Other Ambulatory Visit: Payer: Self-pay | Admitting: *Deleted

## 2015-12-26 MED ORDER — LEVOTHYROXINE SODIUM 75 MCG PO TABS: 75.0000 ug | ORAL_TABLET | Freq: Every day | ORAL | Status: DC

## 2016-01-08 ENCOUNTER — Encounter: Payer: Self-pay | Admitting: Family Medicine

## 2016-01-08 ENCOUNTER — Ambulatory Visit (INDEPENDENT_AMBULATORY_CARE_PROVIDER_SITE_OTHER): Payer: Medicare Other | Admitting: Family Medicine

## 2016-01-08 VITALS — BP 136/72 | HR 68 | Wt 192.0 lb

## 2016-01-08 DIAGNOSIS — E1165 Type 2 diabetes mellitus with hyperglycemia: Secondary | ICD-10-CM | POA: Diagnosis not present

## 2016-01-08 DIAGNOSIS — F4321 Adjustment disorder with depressed mood: Secondary | ICD-10-CM | POA: Diagnosis not present

## 2016-01-08 DIAGNOSIS — IMO0001 Reserved for inherently not codable concepts without codable children: Secondary | ICD-10-CM

## 2016-01-08 DIAGNOSIS — E038 Other specified hypothyroidism: Secondary | ICD-10-CM

## 2016-01-08 LAB — POCT GLYCOSYLATED HEMOGLOBIN (HGB A1C): Hemoglobin A1C: 5.7

## 2016-01-08 MED ORDER — ESCITALOPRAM OXALATE 20 MG PO TABS: 20.0000 mg | ORAL_TABLET | Freq: Every day | ORAL | Status: DC

## 2016-01-08 NOTE — Progress Notes (Signed)
Subjective:    CC: Mood  HPI:  Grief Reaction - Overall she is doing fairly well. She recently moved to [place] to live with her daughter and son-in-law since her husband passed away. She still dealing with a lot of the financial issues related to his death. She still feels down more than half the days. She is taking her Lexapro regularly and wonders if we could increase the dose.  Diabetes - no hypoglycemic events. No wounds or sores that are not healing well. No increased thirst or urination. Checking glucose at home. Taking medications as prescribed without any side effects.   Hypothyroidism-she's doing well on her current regimen. She takes 75 g 4 days a week and 50 g 3 days per week. She denies any recent skin or hair changes or significant weight changes.  Past medical history, Surgical history, Family history not pertinant except as noted below, Social history, Allergies, and medications have been entered into the medical record, reviewed, and corrections made.   Review of Systems: No fevers, chills, night sweats, weight loss, chest pain, or shortness of breath.   Objective:    General: Well Developed, well nourished, and in no acute distress.  Neuro: Alert and oriented x3, extra-ocular muscles intact, sensation grossly intact.  HEENT: Normocephalic, atraumatic  Skin: Warm and dry, no rashes. Cardiac: Regular rate and rhythm, no murmurs rubs or gallops, no lower extremity edema.  Respiratory: Clear to auscultation bilaterally. Not using accessory muscles, speaking in full sentences.   Impression and Recommendations:   Grief reaction-increase Lexapro to 20 mg. Continue to work on healthy living and healthy lifestyle and stress reduction. She is happy about her move which is positive she says is just a life adjustment. PHQ 9 score of 8 today and dad 7 score of 17. She rates her symptoms is somewhat difficult.  Hypothyroidism-due to recheck TSH. Lab slip printed. Will call with  results once available. She does need a refill on her 50 g levothyroxin.  Diabetes-well-controlled. Hemoglobin A1c of 5.7 today. Continue current regimen. Follow-up in 3-4 months.

## 2016-01-08 NOTE — Addendum Note (Signed)
Addended by: Teddy Spike on: 01/08/2016 02:46 PM   Modules accepted: Orders

## 2016-01-09 LAB — TSH: TSH: 2 m[IU]/L

## 2016-01-09 NOTE — Progress Notes (Signed)
Quick Note:  All labs are normal. ______ 

## 2016-01-14 ENCOUNTER — Other Ambulatory Visit: Payer: Self-pay | Admitting: *Deleted

## 2016-01-14 MED ORDER — LEVOTHYROXINE SODIUM 50 MCG PO TABS: 50.0000 ug | ORAL_TABLET | Freq: Every day | ORAL | Status: DC

## 2016-03-09 DIAGNOSIS — Z950 Presence of cardiac pacemaker: Secondary | ICD-10-CM | POA: Diagnosis not present

## 2016-03-09 DIAGNOSIS — I442 Atrioventricular block, complete: Secondary | ICD-10-CM | POA: Diagnosis not present

## 2016-04-08 ENCOUNTER — Other Ambulatory Visit: Payer: Self-pay | Admitting: Family Medicine

## 2016-04-09 ENCOUNTER — Ambulatory Visit: Payer: Medicare Other | Admitting: Family Medicine

## 2016-04-13 ENCOUNTER — Ambulatory Visit (INDEPENDENT_AMBULATORY_CARE_PROVIDER_SITE_OTHER): Payer: Medicare Other | Admitting: Family Medicine

## 2016-04-13 ENCOUNTER — Encounter: Payer: Self-pay | Admitting: Family Medicine

## 2016-04-13 VITALS — BP 132/69 | HR 59 | Wt 192.0 lb

## 2016-04-13 DIAGNOSIS — Z780 Asymptomatic menopausal state: Secondary | ICD-10-CM

## 2016-04-13 DIAGNOSIS — M25531 Pain in right wrist: Secondary | ICD-10-CM

## 2016-04-13 DIAGNOSIS — Z23 Encounter for immunization: Secondary | ICD-10-CM | POA: Diagnosis not present

## 2016-04-13 DIAGNOSIS — E1165 Type 2 diabetes mellitus with hyperglycemia: Secondary | ICD-10-CM

## 2016-04-13 DIAGNOSIS — IMO0001 Reserved for inherently not codable concepts without codable children: Secondary | ICD-10-CM

## 2016-04-13 DIAGNOSIS — I10 Essential (primary) hypertension: Secondary | ICD-10-CM

## 2016-04-13 LAB — POCT GLYCOSYLATED HEMOGLOBIN (HGB A1C): HEMOGLOBIN A1C: 5.7

## 2016-04-13 MED ORDER — METOPROLOL TARTRATE 25 MG PO TABS: 25.0000 mg | ORAL_TABLET | Freq: Two times a day (BID) | ORAL | 1 refills | Status: DC

## 2016-04-13 NOTE — Progress Notes (Signed)
Subjective:    CC: DM  HPI: Diabetes - no hypoglycemic events. No wounds or sores that are not healing well. No increased thirst or urination. Checking glucose at home. Taking medications as prescribed without any side effects.  Hypertension- Pt denies chest pain, SOB, dizziness, or heart palpitations.  Taking meds as directed w/o problems.  Denies medication side effects.    She does have a knot near her wrist on the right side. She's not sure how it happened. She has a remote any trauma or injury but just noticed it's very tender to touch and feels sore. She did just recently traveled to multiple states but does remember carrying any heavy ligature anything on that arm. She's not doing any particular treatment.  Past medical history, Surgical history, Family history not pertinant except as noted below, Social history, Allergies, and medications have been entered into the medical record, reviewed, and corrections made.   Review of Systems: No fevers, chills, night sweats, weight loss, chest pain, or shortness of breath.   Objective:    General: Well Developed, well nourished, and in no acute distress.  Neuro: Alert and oriented x3, extra-ocular muscles intact, sensation grossly intact.  HEENT: Normocephalic, atraumatic  Skin: Warm and dry, no rashes. Cardiac: Regular rate and rhythm, no murmurs rubs or gallops, no lower extremity edema.  Respiratory: Clear to auscultation bilaterally. Not using accessory muscles, speaking in full sentences. MSK: She does have some palpable swelling and tenderness over the distal radius near the thumb joint.    Impression and Recommendations:   DM- Uncontrolled. Hemoglobin A1c of 5.7. Same as previous. Continue current regimen. Follow-up in 4 months.  HTN - Well controlled. Continue current regimen. Follow up in  6 months.   Right wrist pain-give it 2 weeks to heal and improve on its own. If it's not better at that time and recommend x-ray for further  evaluation.

## 2016-06-06 DIAGNOSIS — I442 Atrioventricular block, complete: Secondary | ICD-10-CM | POA: Diagnosis not present

## 2016-06-06 DIAGNOSIS — I441 Atrioventricular block, second degree: Secondary | ICD-10-CM | POA: Diagnosis not present

## 2016-06-06 DIAGNOSIS — Z950 Presence of cardiac pacemaker: Secondary | ICD-10-CM | POA: Diagnosis not present

## 2016-06-22 ENCOUNTER — Other Ambulatory Visit: Payer: Self-pay | Admitting: Family Medicine

## 2016-07-02 ENCOUNTER — Encounter: Payer: Self-pay | Admitting: Family Medicine

## 2016-07-02 LAB — COLOGUARD

## 2016-07-06 ENCOUNTER — Other Ambulatory Visit: Payer: Self-pay | Admitting: Family Medicine

## 2016-07-08 ENCOUNTER — Other Ambulatory Visit: Payer: Self-pay | Admitting: Family Medicine

## 2016-07-23 ENCOUNTER — Other Ambulatory Visit: Payer: Self-pay | Admitting: Family Medicine

## 2016-08-16 ENCOUNTER — Ambulatory Visit: Payer: Medicare Other | Admitting: Family Medicine

## 2016-09-14 ENCOUNTER — Encounter: Payer: Self-pay | Admitting: Family Medicine

## 2016-09-14 DIAGNOSIS — Z950 Presence of cardiac pacemaker: Secondary | ICD-10-CM | POA: Insufficient documentation

## 2016-09-14 DIAGNOSIS — I442 Atrioventricular block, complete: Secondary | ICD-10-CM | POA: Diagnosis not present

## 2016-09-14 DIAGNOSIS — Z45018 Encounter for adjustment and management of other part of cardiac pacemaker: Secondary | ICD-10-CM | POA: Diagnosis not present

## 2016-09-14 DIAGNOSIS — I10 Essential (primary) hypertension: Secondary | ICD-10-CM | POA: Diagnosis not present

## 2016-09-15 ENCOUNTER — Other Ambulatory Visit: Payer: Self-pay | Admitting: Family Medicine

## 2016-10-14 ENCOUNTER — Other Ambulatory Visit: Payer: Self-pay

## 2016-10-14 ENCOUNTER — Telehealth: Payer: Self-pay

## 2016-10-14 MED ORDER — ATORVASTATIN CALCIUM 80 MG PO TABS: ORAL_TABLET | ORAL | 0 refills | Status: DC

## 2016-10-14 NOTE — Telephone Encounter (Signed)
Pt is requesting a refill on lipitor.  I sent her to scheduling for appointment.  Is it ok to refill?

## 2016-10-14 NOTE — Telephone Encounter (Signed)
Sent to pharmacy, notified patient.

## 2016-10-14 NOTE — Telephone Encounter (Signed)
Yes ok to refill for 90 days supply

## 2016-10-15 ENCOUNTER — Ambulatory Visit (INDEPENDENT_AMBULATORY_CARE_PROVIDER_SITE_OTHER): Payer: Medicare Other

## 2016-10-15 ENCOUNTER — Encounter: Payer: Self-pay | Admitting: Family Medicine

## 2016-10-15 ENCOUNTER — Ambulatory Visit (INDEPENDENT_AMBULATORY_CARE_PROVIDER_SITE_OTHER): Payer: Medicare Other | Admitting: Family Medicine

## 2016-10-15 VITALS — BP 139/64 | HR 61 | Ht 70.0 in | Wt 204.0 lb

## 2016-10-15 DIAGNOSIS — E038 Other specified hypothyroidism: Secondary | ICD-10-CM | POA: Diagnosis not present

## 2016-10-15 DIAGNOSIS — I10 Essential (primary) hypertension: Secondary | ICD-10-CM | POA: Diagnosis not present

## 2016-10-15 DIAGNOSIS — Z1231 Encounter for screening mammogram for malignant neoplasm of breast: Secondary | ICD-10-CM | POA: Diagnosis not present

## 2016-10-15 DIAGNOSIS — IMO0001 Reserved for inherently not codable concepts without codable children: Secondary | ICD-10-CM

## 2016-10-15 DIAGNOSIS — E1165 Type 2 diabetes mellitus with hyperglycemia: Secondary | ICD-10-CM

## 2016-10-15 DIAGNOSIS — E7800 Pure hypercholesterolemia, unspecified: Secondary | ICD-10-CM | POA: Diagnosis not present

## 2016-10-15 LAB — LIPID PANEL W/REFLEX DIRECT LDL
Cholesterol: 157 mg/dL (ref <200)
HDL: 38 mg/dL — ABNORMAL LOW (ref >50)
LDL-CHOLESTEROL: 94 mg/dL
NON-HDL CHOLESTEROL (CALC): 119 mg/dL (ref <130)
TRIGLYCERIDES: 155 mg/dL — AB (ref <150)
Total CHOL/HDL Ratio: 4.1 Ratio (ref <5.0)

## 2016-10-15 LAB — TSH: TSH: 1.49 m[IU]/L

## 2016-10-15 LAB — POCT GLYCOSYLATED HEMOGLOBIN (HGB A1C): HEMOGLOBIN A1C: 6.2

## 2016-10-15 LAB — COMPLETE METABOLIC PANEL WITH GFR
ALT: 22 U/L (ref 6–29)
AST: 26 U/L (ref 10–35)
Albumin: 4.2 g/dL (ref 3.6–5.1)
Alkaline Phosphatase: 53 U/L (ref 33–130)
BUN: 20 mg/dL (ref 7–25)
CO2: 26 mmol/L (ref 20–31)
Calcium: 9.2 mg/dL (ref 8.6–10.4)
Chloride: 106 mmol/L (ref 98–110)
Creat: 1.04 mg/dL — ABNORMAL HIGH (ref 0.60–0.93)
GFR, Est African American: 61 mL/min (ref >=60)
GFR, Est Non African American: 53 mL/min — ABNORMAL LOW (ref >=60)
GLUCOSE: 74 mg/dL (ref 65–99)
Potassium: 4.5 mmol/L (ref 3.5–5.3)
SODIUM: 141 mmol/L (ref 135–146)
TOTAL PROTEIN: 7.5 g/dL (ref 6.1–8.1)
Total Bilirubin: 0.6 mg/dL (ref 0.2–1.2)

## 2016-10-15 MED ORDER — ATORVASTATIN CALCIUM 80 MG PO TABS: 80.0000 mg | ORAL_TABLET | Freq: Every day | ORAL | 3 refills | Status: AC

## 2016-10-15 MED ORDER — METOPROLOL TARTRATE 25 MG PO TABS: 25.0000 mg | ORAL_TABLET | Freq: Two times a day (BID) | ORAL | 1 refills | Status: DC

## 2016-10-15 NOTE — Progress Notes (Signed)
Subjective:    CC: DM  HPI:  She is trying to find a primary care provider closer to where she lives. There is an office at [facility] which would be convenient for her.  Diabetes - no hypoglycemic events. No wounds or sores that are not healing well. No increased thirst or urination. Checking glucose at home. Taking medications as prescribed without any side effects.  Hypertension- Pt denies chest pain, SOB, dizziness, or heart palpitations.  Taking meds as directed w/o problems.  Denies medication side effects.    Hyperlipidemia - tolerating  Statin well with no myalgias or side effects.    Past medical history, Surgical history, Family history not pertinant except as noted below, Social history, Allergies, and medications have been entered into the medical record, reviewed, and corrections made.   Review of Systems: No fevers, chills, night sweats, weight loss, chest pain, or shortness of breath.   Objective:    General: Well Developed, well nourished, and in no acute distress.  Neuro: Alert and oriented x3, extra-ocular muscles intact, sensation grossly intact.  HEENT: Normocephalic, atraumatic  Skin: Warm and dry, no rashes. Cardiac: Regular rate and rhythm, no murmurs rubs or gallops, no lower extremity edema.  Respiratory: Clear to auscultation bilaterally. Not using accessory muscles, speaking in full sentences.   Impression and Recommendations:    DM - Well controlled. A1C of 6.2.  Continue current regimen. Follow up in  4 months. Urine micro albumin performed today.  HTN - Well controlled. Continue current regimen. Follow up in  6 months.    Hyperlipidemia - due to recheck lipoids. RF sent for one year.    Mammogram ordered. She will try to go downstairs and get it done today.

## 2016-10-16 LAB — MICROALBUMIN, URINE: Microalb, Ur: 2.6 mg/dL

## 2016-10-19 ENCOUNTER — Encounter: Payer: Self-pay | Admitting: Family Medicine

## 2016-12-13 ENCOUNTER — Other Ambulatory Visit: Payer: Self-pay | Admitting: *Deleted

## 2016-12-13 MED ORDER — LEVOTHYROXINE SODIUM 50 MCG PO TABS: 50.0000 ug | ORAL_TABLET | Freq: Every day | ORAL | 2 refills | Status: DC

## 2016-12-20 DIAGNOSIS — Z950 Presence of cardiac pacemaker: Secondary | ICD-10-CM | POA: Diagnosis not present

## 2016-12-20 DIAGNOSIS — Z45010 Encounter for checking and testing of cardiac pacemaker pulse generator [battery]: Secondary | ICD-10-CM | POA: Diagnosis not present

## 2016-12-20 DIAGNOSIS — Z45018 Encounter for adjustment and management of other part of cardiac pacemaker: Secondary | ICD-10-CM | POA: Diagnosis not present

## 2017-01-01 ENCOUNTER — Other Ambulatory Visit: Payer: Self-pay | Admitting: Family Medicine

## 2017-01-06 ENCOUNTER — Other Ambulatory Visit: Payer: Self-pay | Admitting: Family Medicine

## 2017-02-07 ENCOUNTER — Other Ambulatory Visit: Payer: Self-pay | Admitting: Family Medicine

## 2017-03-28 DIAGNOSIS — Z45010 Encounter for checking and testing of cardiac pacemaker pulse generator [battery]: Secondary | ICD-10-CM | POA: Diagnosis not present

## 2017-03-28 DIAGNOSIS — Z45018 Encounter for adjustment and management of other part of cardiac pacemaker: Secondary | ICD-10-CM | POA: Diagnosis not present

## 2017-03-28 DIAGNOSIS — I472 Ventricular tachycardia: Secondary | ICD-10-CM | POA: Diagnosis not present

## 2017-03-28 DIAGNOSIS — Z950 Presence of cardiac pacemaker: Secondary | ICD-10-CM | POA: Diagnosis not present

## 2017-04-02 ENCOUNTER — Other Ambulatory Visit: Payer: Self-pay | Admitting: Family Medicine

## 2017-04-04 ENCOUNTER — Other Ambulatory Visit: Payer: Self-pay | Admitting: Family Medicine

## 2017-04-04 DIAGNOSIS — I10 Essential (primary) hypertension: Secondary | ICD-10-CM

## 2017-04-05 ENCOUNTER — Other Ambulatory Visit: Payer: Self-pay | Admitting: Family Medicine

## 2017-04-22 DIAGNOSIS — Z23 Encounter for immunization: Secondary | ICD-10-CM | POA: Diagnosis not present

## 2017-05-03 DIAGNOSIS — Z950 Presence of cardiac pacemaker: Secondary | ICD-10-CM | POA: Diagnosis not present

## 2017-05-03 DIAGNOSIS — I10 Essential (primary) hypertension: Secondary | ICD-10-CM | POA: Diagnosis not present

## 2017-05-03 DIAGNOSIS — E039 Hypothyroidism, unspecified: Secondary | ICD-10-CM | POA: Diagnosis not present

## 2017-05-03 DIAGNOSIS — E119 Type 2 diabetes mellitus without complications: Secondary | ICD-10-CM | POA: Diagnosis not present

## 2017-05-03 DIAGNOSIS — Z6830 Body mass index (BMI) 30.0-30.9, adult: Secondary | ICD-10-CM | POA: Diagnosis not present

## 2017-05-03 DIAGNOSIS — F329 Major depressive disorder, single episode, unspecified: Secondary | ICD-10-CM | POA: Diagnosis not present

## 2017-05-03 DIAGNOSIS — E7800 Pure hypercholesterolemia, unspecified: Secondary | ICD-10-CM | POA: Diagnosis not present

## 2017-05-04 DIAGNOSIS — E039 Hypothyroidism, unspecified: Secondary | ICD-10-CM | POA: Diagnosis not present

## 2017-05-04 DIAGNOSIS — E119 Type 2 diabetes mellitus without complications: Secondary | ICD-10-CM | POA: Diagnosis not present

## 2017-05-04 DIAGNOSIS — E7800 Pure hypercholesterolemia, unspecified: Secondary | ICD-10-CM | POA: Diagnosis not present

## 2017-05-12 DIAGNOSIS — Z950 Presence of cardiac pacemaker: Secondary | ICD-10-CM | POA: Diagnosis not present

## 2017-05-12 DIAGNOSIS — I5189 Other ill-defined heart diseases: Secondary | ICD-10-CM | POA: Diagnosis not present

## 2017-05-12 DIAGNOSIS — I517 Cardiomegaly: Secondary | ICD-10-CM | POA: Diagnosis not present

## 2017-05-12 DIAGNOSIS — I083 Combined rheumatic disorders of mitral, aortic and tricuspid valves: Secondary | ICD-10-CM | POA: Diagnosis not present

## 2017-05-12 DIAGNOSIS — I519 Heart disease, unspecified: Secondary | ICD-10-CM | POA: Diagnosis not present

## 2017-06-10 DIAGNOSIS — I2510 Atherosclerotic heart disease of native coronary artery without angina pectoris: Secondary | ICD-10-CM | POA: Diagnosis not present

## 2017-07-06 DIAGNOSIS — Z45018 Encounter for adjustment and management of other part of cardiac pacemaker: Secondary | ICD-10-CM | POA: Diagnosis not present

## 2017-07-06 DIAGNOSIS — Z45010 Encounter for checking and testing of cardiac pacemaker pulse generator [battery]: Secondary | ICD-10-CM | POA: Diagnosis not present

## 2017-07-06 DIAGNOSIS — Z8679 Personal history of other diseases of the circulatory system: Secondary | ICD-10-CM | POA: Diagnosis not present

## 2017-08-03 DIAGNOSIS — F331 Major depressive disorder, recurrent, moderate: Secondary | ICD-10-CM | POA: Diagnosis not present

## 2017-08-03 DIAGNOSIS — Z6830 Body mass index (BMI) 30.0-30.9, adult: Secondary | ICD-10-CM | POA: Diagnosis not present

## 2017-09-02 DIAGNOSIS — F331 Major depressive disorder, recurrent, moderate: Secondary | ICD-10-CM | POA: Diagnosis not present

## 2017-09-02 DIAGNOSIS — Z6829 Body mass index (BMI) 29.0-29.9, adult: Secondary | ICD-10-CM | POA: Diagnosis not present

## 2017-09-19 ENCOUNTER — Other Ambulatory Visit: Payer: Self-pay | Admitting: *Deleted

## 2017-09-19 MED ORDER — LEVOTHYROXINE SODIUM 50 MCG PO TABS: 50.0000 ug | ORAL_TABLET | Freq: Every day | ORAL | 0 refills | Status: DC

## 2017-09-19 MED ORDER — LEVOTHYROXINE SODIUM 75 MCG PO TABS: 75.0000 ug | ORAL_TABLET | Freq: Every day | ORAL | 0 refills | Status: AC

## 2017-10-17 NOTE — Progress Notes (Signed)
 Primary Care Provider:  Almarie Jaeger, DO Primary Cardiologist:  Dr. Cornelia   Chief complaint:  none   HPI:  Brandi Singleton is a 77 y.o. female seen today for evaluation of hypertension, NSVT, LV dysfunction and pacemaker implant due to complete heart block.  Her EF was 60% in 2016 prior to pacemaker implantation.  She had NSVT on pacemaker interrogation in September 2018.  She had a cardiolite in November 2018 that was negative for ischemia but her EF was 40-45%.  She was placed on Toprol .  She  denies any chest pain, dyspnea, palpitations, orthopnea, PND, significant weight changes, lower extremity edema, or syncope.  She admits to a fall in January in her bathroom.  She had some bruising but no major injuries.  She complains of arthritic discomfort and has pain in her legs walking across a parking lot.  She denies any bleeding issues or signs of infection.    Allergies  Allergen Reactions  . Hydrocodone Nausea Only   Past Medical History:  Diagnosis Date  . Diabetes mellitus type 2 in obese (*)   . Elevated cholesterol   . Elevated diaphragm    Right   . Hyperlipidemia   . Hypertension   . Hypothyroid    Past Surgical History:  Procedure Laterality Date  . Hysterectomy          Medication Sig Dispense Refill  . ascorbic acid (VITAMIN C) 500 MG tablet Take 500 mg by mouth daily.    SABRA aspirin (ASPIRIN) 81 mg chewable tablet Chew 81 mg by mouth daily.    . atorvastatin  (LIPITOR) 80 mg tablet Take 80 mg by mouth daily.    SABRA buPROPion hcl (WELLBUTRIN XL) 300 mg 24 hr tablet Take 300 mg by mouth daily.  1  . calcium  carbonate-vitamin D (CALTRATE 600+D) 600-400 mg-unit per tablet Take 1 tablet by mouth 2 (two) times daily.    . Echinacea 125 MG CAPS Take by mouth.    . escitalopram  oxalate (LEXAPRO ) 20 mg tablet Take 20 mg by mouth daily.    . fish oil 1000 mg CAPS Take 2 capsules by mouth 2 (two) times daily.     . glipiZIDE  (GLUCOTROL ) 5 mg tablet Take by mouth.     . levothyroxine  (SYNTHROID , LEVOTHROID) 50 mcg tablet Take 50 mcg by mouth see administration instructions. Take 50mcg on Mon. Weds. Thurs.    . levothyroxine  sodium (SYNTHROID , LEVOTHROID) 75 mcg tablet Take 75 mcg by mouth see administration instructions. Take 75mcg on all other days    . Lysine 500 MG TABS Take 2 tablets by mouth daily.     . metformin  (GLUCOPHAGE ) 1000 MG tablet Take 500 mg by mouth 2 (two) times daily with meals.     . metoprolol  succinate (TOPROL  XL) 100 mg 24 hr tablet Take one tablet (100 mg total) by mouth daily. 30 tablet 5  . multivitamin plain (MULTIVITAMIN) TABS Take 1 tablet by mouth daily.    SABRA aspirin-acetaminophen-caffeine (EXCEDRIN MIGRAINE) 250-250-65 MG per tablet Take 1 tablet by mouth every 6 (six) hours as needed for Pain.     No current facility-administered medications for this visit.    Family History  Problem Relation Age of Onset  . Heart disease Mother   . Heart failure Father    Social History   Social History  . Marital status: Married    Spouse name: N/A  . Number of children: N/A  . Years of education: N/A   Occupational  History  . Not on file.   Social History Main Topics  . Smoking status: Former Games developer  . Smokeless tobacco: Never Used  . Alcohol use No  . Drug use: No  . Sexual activity: Not on file   Other Topics Concern  . Not on file   Social History Narrative  . No narrative on file    History  Smoking Status  . Former Smoker  Smokeless Tobacco  . Never Used    ROS:  All systems reviewed and negative except for the above.  Physical Exam:  Vitals:   10/18/17 1057  BP: 128/88  Pulse: 61  SpO2: 94%  Weight: 208 lb (94.3 kg)  Height: 5' 10 (1.778 m)    Gen: Well developed, female in no apparent distress.  HENT: Normalcephalic, atraumatic, nose normal.  Eyes:  Pupils equal and round.  Conjunctiva normal, without discharge, sclera nonicteric.  Neck: Supple, no JVD, thyroidmegaly, or carotid bruits.  CV:  S1-S2 regular rate and rhythm with out murmur.  Intact pulses. 2+ in upper extremities and 1+ in lower extremities.  Pulmonary/Chest: Normal effort.  Lungs clear.  No chest tenderness.  ABD: Positive bowel sounds, soft, nontender.  No guarding or masses.  Musculoskeletal:  Normal ROM without edema.  Neuro:  Awake, alert and oriented.  Skin: Warm, dry, intact.  No rashes.  Psych:  Normal mood/behavior.     DC - Batt 6 yrs; Atrial lead impedance >3000, vent impedance WNL; Atrial thresholds elevated since 08/11/17, vent threshold WNL;  Episodes: 7176 AHR's - all appear to be atrial lead noise; Ind: CHB; Hist: Good; Site normal; Isometrics revealed atrial lead noise w/ minimal movement  Impression:  1. NSVT (nonsustained ventricular tachycardia) with EF 40-45% in 05/2017:  Her reduced EF is likely pacemaker mediated.  No new episodes :    2. Nonocclusive coronary atherosclerosis of native coronary artery : She denies any angina.  Statin therapy as well as aspirin.   3. CHB (complete heart block) status post pacemaker implantation with atrial lead fracture:  I discussed treatment options.   4. Essential hypertension : Blood pressure well controlled.        Plan:    At this time Brandi Singleton appears to be in no distress. She has an atrial lead fracture.  Her previous device interrogation showed she paces over 30%,  Her ventricular lead is functioning appropriately.  I discussed with her about the complicated matter of atrial lead explant and new atrial lead implant.  Also, given her LV dysfunction, she may benefit from an upgrade to biventricular pacemaker since she has underlying complete heart block and requires ventricular pacing.  If her EF is less than 30-35% she may need an ICD.  I will check update her echo since she has been on toprol  for over 3 months.  She states her blood pressure is well controlled and likely would not tolerate and ACE-I or ARB.    She verbalizes understanding and  will proceed atrial lead explant with new atrial lead implant +/- upgrade to biventricular pacemaker or possible ICD depending on her echo results.    Care plan and follow-up as discussed or as needed if any worsening symptoms or change in condition. Pt expressed understanding. No barriers to meeting goals. For any new medications, the patient/family were instructed about directions for and consequences of not taking medication. They were informed about the potential side effects and drug interactions.  The patient/family voices understanding of all medications. After  visit summary given to patient.  This note was dictated with voice recognition software. Similar sounding words can inadvertently get transcribed and not corrected upon review.

## 2017-10-18 DIAGNOSIS — Z45018 Encounter for adjustment and management of other part of cardiac pacemaker: Secondary | ICD-10-CM | POA: Diagnosis not present

## 2017-10-18 DIAGNOSIS — I472 Ventricular tachycardia: Secondary | ICD-10-CM | POA: Diagnosis not present

## 2017-10-18 DIAGNOSIS — I442 Atrioventricular block, complete: Secondary | ICD-10-CM | POA: Diagnosis not present

## 2017-10-18 DIAGNOSIS — I2510 Atherosclerotic heart disease of native coronary artery without angina pectoris: Secondary | ICD-10-CM | POA: Diagnosis not present

## 2017-10-18 DIAGNOSIS — I10 Essential (primary) hypertension: Secondary | ICD-10-CM | POA: Diagnosis not present

## 2017-10-18 DIAGNOSIS — Z45010 Encounter for checking and testing of cardiac pacemaker pulse generator [battery]: Secondary | ICD-10-CM | POA: Diagnosis not present

## 2017-11-10 DIAGNOSIS — I083 Combined rheumatic disorders of mitral, aortic and tricuspid valves: Secondary | ICD-10-CM | POA: Diagnosis not present

## 2017-12-11 IMAGING — MR MR HEAD W/O CM
9 of 10 series · 35 of 48 positions shown · non-contrast
Comparison: None.

CLINICAL DATA: 75-year-old female with dizziness when lying down at
night. Vertical nystagmus. Initial encounter.

MRI compatible pacemaker.
EXAM:
MRI HEAD WITHOUT CONTRAST
TECHNIQUE: Multiplanar, multiecho pulse sequences of the brain and surrounding
structures were obtained without intravenous contrast.

[Series 3: T1 · sagittal · 5.0mm · 0.47mm/px · 1 of 22 slices shown]
[im 1/22]
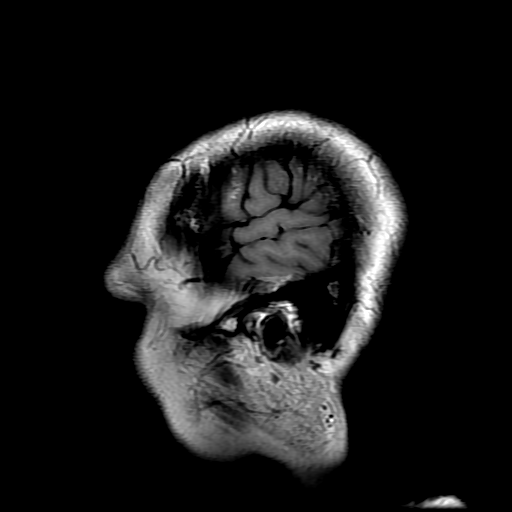

[Series 4: DWI · axial · 3.0mm · 1.09mm/px · z∈[-39,+106]mm · 8 of 100 slices shown (1 of 4)]
[im 1/100]
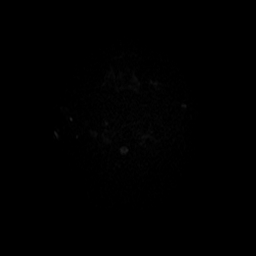
[im 12/100]
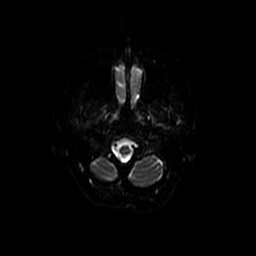
[im 34/100]
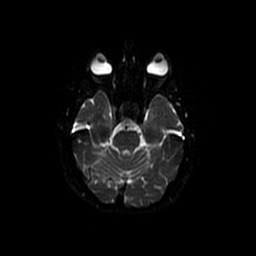
[im 45/100]
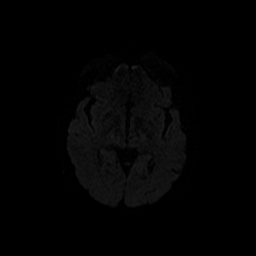
[im 56/100]
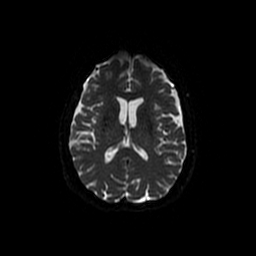
[im 67/100]
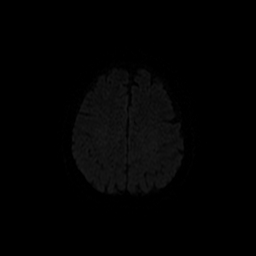
[im 89/100]
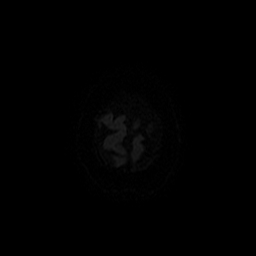
[im 100/100]
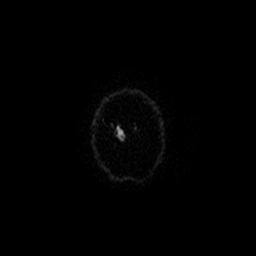

[Series 5: T2 · axial · 5.0mm · 0.43mm/px · z∈[-40,+108]mm · 3 of 26 slices shown]
[im 1/26]
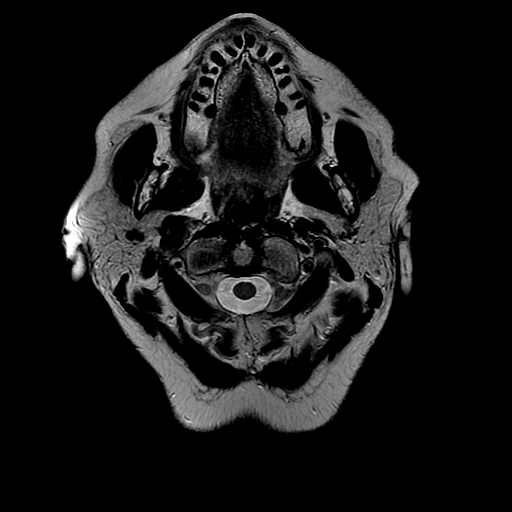
[im 13/26]
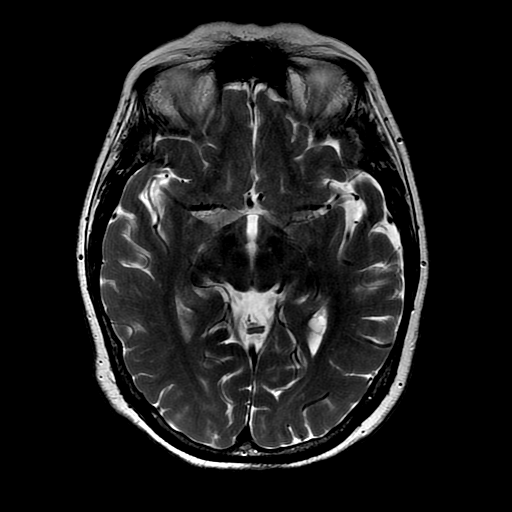
[im 26/26]
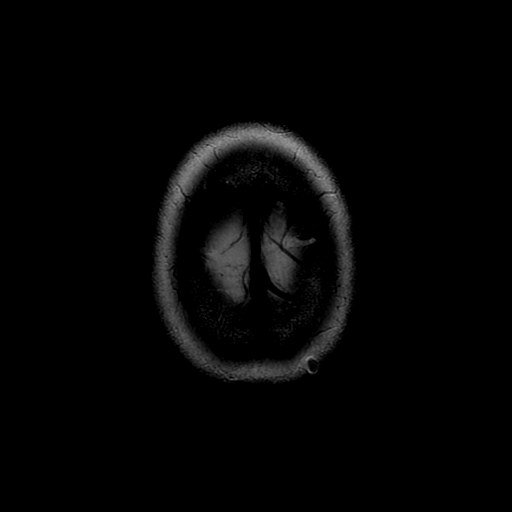

[Series 6: DWI · coronal · 5.0mm · 1.09mm/px · 7 of 70 slices shown (2 of 4)]
[im 1/70]
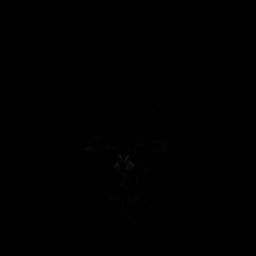
[im 12/70]
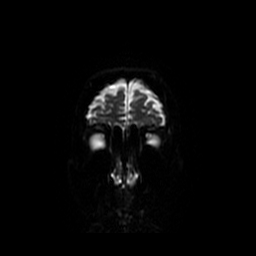
[im 24/70]
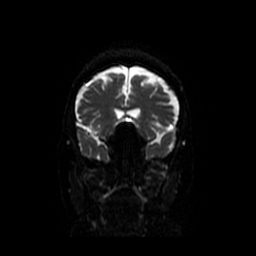
[im 35/70]
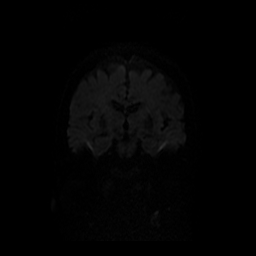
[im 47/70]
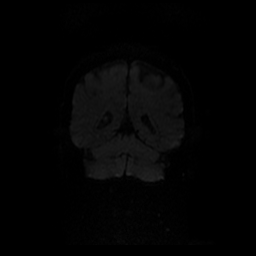
[im 58/70]
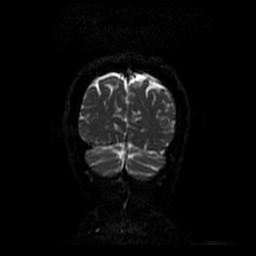
[im 70/70]
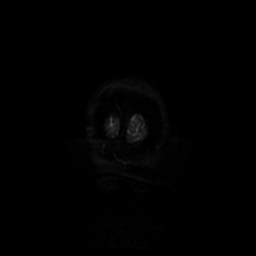

[Series 7: FLAIR · axial · 5.0mm · 0.43mm/px · z∈[-40,+108]mm · 3 of 26 slices shown]
[im 1/26]
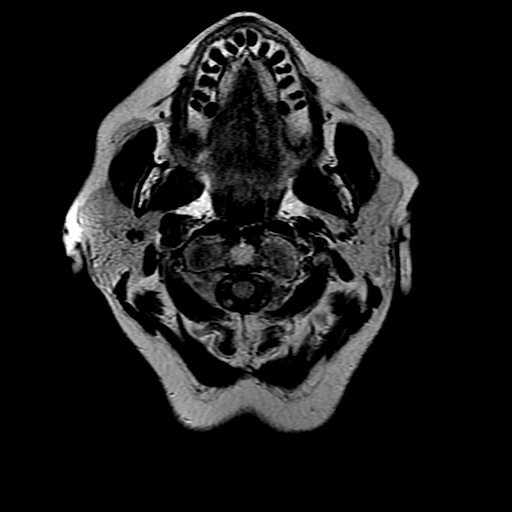
[im 13/26]
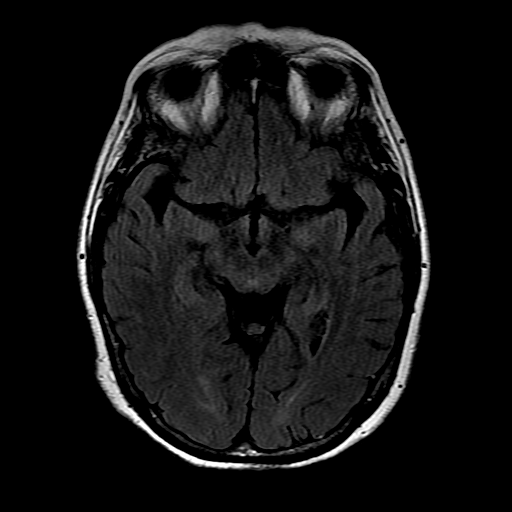
[im 26/26]
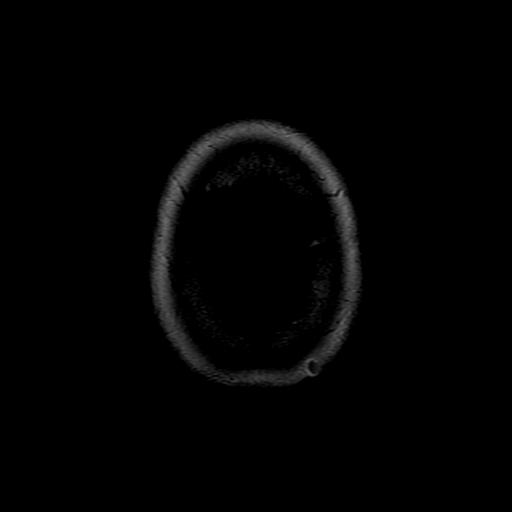

[Series 8: ax mpgr · axial · 5.0mm · 0.43mm/px · z∈[-40,+31]mm · 2 of 26 slices shown]
[im 1/26]
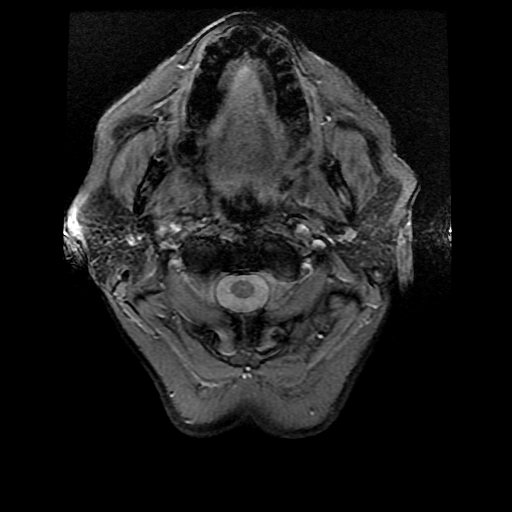
[im 13/26]
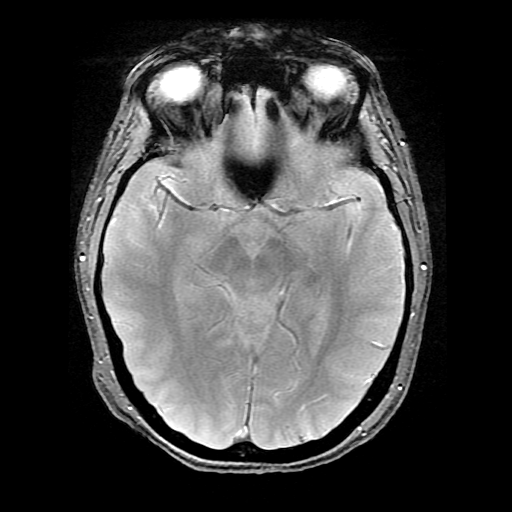

[Series 10: T2 post-contrast · coronal · 5.0mm · 0.39mm/px · 2 of 25 slices shown]
[im 1/25]
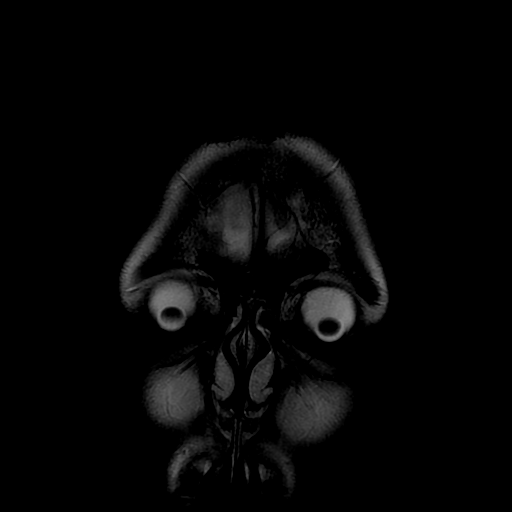
[im 25/25]
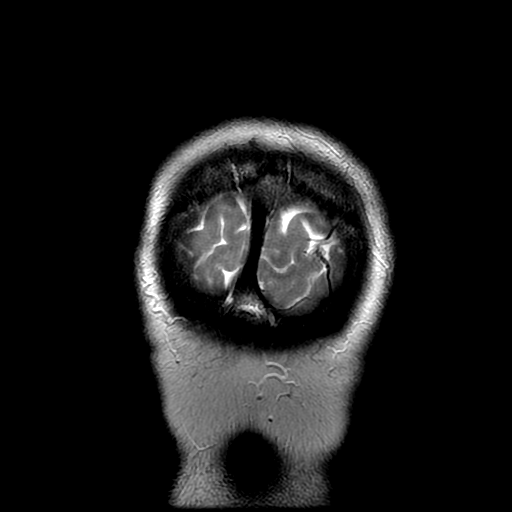

[Series 400: DWI · axial · 3.0mm · 1.09mm/px · z∈[-39,+106]mm · 5 of 50 slices shown (3 of 4)]
[im 1/50]
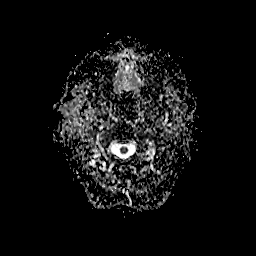
[im 13/50]
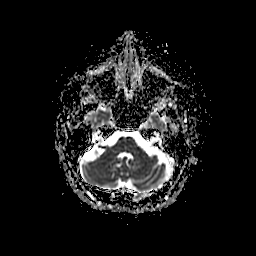
[im 25/50]
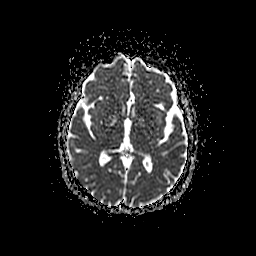
[im 37/50]
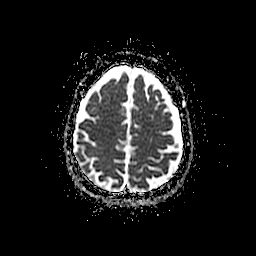
[im 50/50]
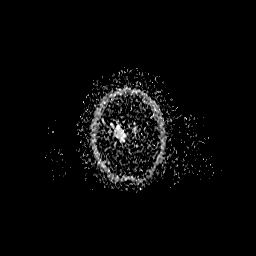

[Series 600: DWI · coronal · 5.0mm · 1.09mm/px · 4 of 35 slices shown (4 of 4)]
[im 1/35]
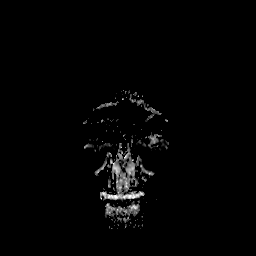
[im 12/35]
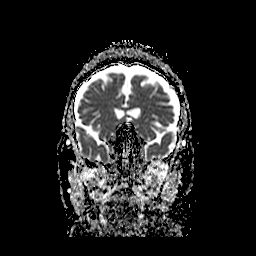
[im 23/35]
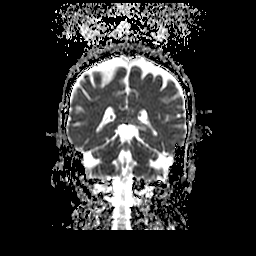
[im 35/35]
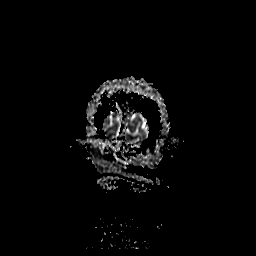

[35 of 48 positions shown; findings below may reference images not displayed]

FINDINGS: Cerebral volume is within normal limits for age. No restricted
diffusion to suggest acute infarction. No midline shift, mass
effect, evidence of mass lesion, ventriculomegaly, extra-axial
collection or acute intracranial hemorrhage. Cervicomedullary
junction and pituitary are within normal limits. Negative visualized
cervical spine. Major intracranial vascular flow voids are within
normal limits.

There is a small solitary chronic infarct in the right superior
cerebellum (series 5, image 9). The brainstem appears normal.
Elsewhere gray and white matter signal is within normal limits for
age throughout the brain. No other encephalomalacia and no chronic
cerebral blood products identified.

Visible internal auditory structures appear normal. Mastoids are
clear. Paranasal sinuses are clear. Orbits soft tissues are within
normal limits. Negative scalp soft tissues. Normal bone marrow
signal.
IMPRESSION: 1.  No acute intracranial abnormality.
2. Normal for age noncontrast MRI appearance of the brain aside from
a solitary chronic lacunar infarct in the right superior cerebellum.

## 2017-12-15 DIAGNOSIS — I429 Cardiomyopathy, unspecified: Secondary | ICD-10-CM | POA: Diagnosis not present

## 2017-12-15 DIAGNOSIS — Z950 Presence of cardiac pacemaker: Secondary | ICD-10-CM | POA: Diagnosis not present

## 2017-12-15 DIAGNOSIS — R9389 Abnormal findings on diagnostic imaging of other specified body structures: Secondary | ICD-10-CM | POA: Diagnosis not present

## 2017-12-15 DIAGNOSIS — T82110A Breakdown (mechanical) of cardiac electrode, initial encounter: Secondary | ICD-10-CM | POA: Diagnosis not present

## 2017-12-15 DIAGNOSIS — I10 Essential (primary) hypertension: Secondary | ICD-10-CM | POA: Diagnosis not present

## 2017-12-20 DIAGNOSIS — Z01818 Encounter for other preprocedural examination: Secondary | ICD-10-CM | POA: Diagnosis not present

## 2017-12-20 DIAGNOSIS — I491 Atrial premature depolarization: Secondary | ICD-10-CM | POA: Diagnosis not present

## 2017-12-20 DIAGNOSIS — R918 Other nonspecific abnormal finding of lung field: Secondary | ICD-10-CM | POA: Diagnosis not present

## 2017-12-20 DIAGNOSIS — Z950 Presence of cardiac pacemaker: Secondary | ICD-10-CM | POA: Diagnosis not present

## 2017-12-21 DIAGNOSIS — I442 Atrioventricular block, complete: Secondary | ICD-10-CM | POA: Diagnosis not present

## 2017-12-21 DIAGNOSIS — I428 Other cardiomyopathies: Secondary | ICD-10-CM | POA: Diagnosis not present

## 2017-12-21 DIAGNOSIS — T82110A Breakdown (mechanical) of cardiac electrode, initial encounter: Secondary | ICD-10-CM | POA: Diagnosis not present

## 2017-12-21 DIAGNOSIS — E039 Hypothyroidism, unspecified: Secondary | ICD-10-CM | POA: Diagnosis not present

## 2017-12-21 DIAGNOSIS — I429 Cardiomyopathy, unspecified: Secondary | ICD-10-CM | POA: Diagnosis not present

## 2017-12-21 DIAGNOSIS — Z7984 Long term (current) use of oral hypoglycemic drugs: Secondary | ICD-10-CM | POA: Diagnosis not present

## 2017-12-21 DIAGNOSIS — T82190A Other mechanical complication of cardiac electrode, initial encounter: Secondary | ICD-10-CM | POA: Diagnosis not present

## 2017-12-21 DIAGNOSIS — I509 Heart failure, unspecified: Secondary | ICD-10-CM | POA: Diagnosis not present

## 2017-12-21 DIAGNOSIS — E785 Hyperlipidemia, unspecified: Secondary | ICD-10-CM | POA: Diagnosis not present

## 2017-12-21 DIAGNOSIS — I082 Rheumatic disorders of both aortic and tricuspid valves: Secondary | ICD-10-CM | POA: Diagnosis not present

## 2017-12-21 DIAGNOSIS — Z006 Encounter for examination for normal comparison and control in clinical research program: Secondary | ICD-10-CM | POA: Diagnosis not present

## 2017-12-21 DIAGNOSIS — Z79899 Other long term (current) drug therapy: Secondary | ICD-10-CM | POA: Diagnosis not present

## 2017-12-21 DIAGNOSIS — Z87891 Personal history of nicotine dependence: Secondary | ICD-10-CM | POA: Diagnosis not present

## 2017-12-21 DIAGNOSIS — I472 Ventricular tachycardia: Secondary | ICD-10-CM | POA: Diagnosis not present

## 2017-12-21 DIAGNOSIS — I5030 Unspecified diastolic (congestive) heart failure: Secondary | ICD-10-CM | POA: Diagnosis not present

## 2017-12-21 DIAGNOSIS — Z7982 Long term (current) use of aspirin: Secondary | ICD-10-CM | POA: Diagnosis not present

## 2017-12-21 DIAGNOSIS — I2510 Atherosclerotic heart disease of native coronary artery without angina pectoris: Secondary | ICD-10-CM | POA: Diagnosis not present

## 2017-12-21 DIAGNOSIS — I110 Hypertensive heart disease with heart failure: Secondary | ICD-10-CM | POA: Diagnosis not present

## 2017-12-21 DIAGNOSIS — E119 Type 2 diabetes mellitus without complications: Secondary | ICD-10-CM | POA: Diagnosis not present

## 2017-12-21 NOTE — Anesthesia Postprocedure Evaluation (Signed)
  Patient: Brandi Singleton Procedure(s): LASER LEAD EXTRACTION BiV ICD UPGRADE Anesthesia type: general, invasive monitoring  Patient location:  PACU Patient participation:  Patient able to participate in this evaluation at age appropriate level.  Vital signs reviewed and can be found in nursing documentation.   Post vital signs:   stable Level of consciousness:   awake and oriented  Post-anesthesia pain:   adequate analgesia Airway patency:   patent Respiratory:   unassisted, respiration function adequate, spontaneous ventilation Cardiovascular:   stable, blood pressure acceptable and heart rate acceptable Hydration:   adequate hydration Temperature: temperature adequate >96.29F PONV:  nausea and vomiting controlled Regional anesthesia: no block performed  Anesthetic complications:   no

## 2017-12-21 NOTE — Nursing Note (Signed)
 BR com,pleted up to BR voided qs and freely back to bed with ou incidents.

## 2017-12-22 DIAGNOSIS — T82190A Other mechanical complication of cardiac electrode, initial encounter: Secondary | ICD-10-CM | POA: Diagnosis not present

## 2017-12-22 DIAGNOSIS — I442 Atrioventricular block, complete: Secondary | ICD-10-CM | POA: Diagnosis not present

## 2017-12-22 DIAGNOSIS — I472 Ventricular tachycardia: Secondary | ICD-10-CM | POA: Diagnosis not present

## 2017-12-22 DIAGNOSIS — I082 Rheumatic disorders of both aortic and tricuspid valves: Secondary | ICD-10-CM | POA: Diagnosis not present

## 2017-12-22 DIAGNOSIS — I110 Hypertensive heart disease with heart failure: Secondary | ICD-10-CM | POA: Diagnosis not present

## 2017-12-22 DIAGNOSIS — R9389 Abnormal findings on diagnostic imaging of other specified body structures: Secondary | ICD-10-CM | POA: Diagnosis not present

## 2017-12-22 DIAGNOSIS — I428 Other cardiomyopathies: Secondary | ICD-10-CM | POA: Diagnosis not present

## 2017-12-22 DIAGNOSIS — Z95810 Presence of automatic (implantable) cardiac defibrillator: Secondary | ICD-10-CM | POA: Diagnosis not present

## 2017-12-22 DIAGNOSIS — I517 Cardiomegaly: Secondary | ICD-10-CM | POA: Diagnosis not present

## 2017-12-28 NOTE — Progress Notes (Signed)
 Pt came for wound check S/P PM implant.  No redness, drainage noted.  Edges approximated well, dermabond intact. Extensive yellowish purplish bruising noted that extends from pocket to breast.  Site is not painful and surrounding tissue is soft.  Post op care reviewed, pt verbalized understanding. Reviewed follow up appts.  Pt verbalized understanding.  KO

## 2018-01-09 DIAGNOSIS — H0263 Xanthelasma of right eye, unspecified eyelid: Secondary | ICD-10-CM | POA: Diagnosis not present

## 2018-01-09 DIAGNOSIS — Z6828 Body mass index (BMI) 28.0-28.9, adult: Secondary | ICD-10-CM | POA: Diagnosis not present

## 2018-02-07 DIAGNOSIS — J069 Acute upper respiratory infection, unspecified: Secondary | ICD-10-CM | POA: Diagnosis not present

## 2018-02-07 DIAGNOSIS — Z6828 Body mass index (BMI) 28.0-28.9, adult: Secondary | ICD-10-CM | POA: Diagnosis not present

## 2018-02-23 NOTE — Progress Notes (Signed)
 Primary Care Provider:  Almarie Jaeger, DO Primary Cardiologist:  Dr. Cornelia   Chief complaint:  none   HPI:  Brandi Singleton is a 77 y.o. female seen today for evaluation of hypertension, nonsustained ventricular tachycardia, pacemaker plantation secondary to complete heart block with subsequent development of dilated cardiomyopathy and recent upgrade to biventricular ICD implantation on December 21, 2017.  She also had a new right atrial lead implanted.    Allergies  Allergen Reactions  . Hydrocodone Nausea Only       Medication Sig Dispense Refill  . ascorbic acid (VITAMIN C) 500 MG tablet Take 500 mg by mouth daily.    SABRA aspirin (ASPIRIN) 81 mg chewable tablet Chew 81 mg by mouth daily.    SABRA aspirin-acetaminophen-caffeine (EXCEDRIN MIGRAINE) 250-250-65 MG per tablet Take 1 tablet by mouth every 6 (six) hours as needed for Pain.    . atorvastatin  (LIPITOR) 80 mg tablet Take 80 mg by mouth daily.    SABRA buPROPion hcl (WELLBUTRIN XL) 300 mg 24 hr tablet Take 300 mg by mouth daily.  1  . calcium  carbonate-vitamin D (CALTRATE 600+D) 600-400 mg-unit per tablet Take 1 tablet by mouth 2 (two) times daily.    . Echinacea 125 MG CAPS Take by mouth 2 (two) times daily.     . escitalopram  oxalate (LEXAPRO ) 20 mg tablet Take 20 mg by mouth daily.    . fish oil 1000 mg CAPS Take 2 capsules by mouth 2 (two) times daily.     . levothyroxine  (SYNTHROID , LEVOTHROID) 50 mcg tablet Take 50 mcg by mouth see administration instructions. Take 50mcg on Mon. Weds. Thurs.    . levothyroxine  sodium (SYNTHROID , LEVOTHROID) 75 mcg tablet Take 75 mcg by mouth see administration instructions. Take 75mcg on all other days    . Lysine 500 MG TABS Take 2 tablets by mouth daily.     . metformin  (GLUCOPHAGE ) 1000 MG tablet Take 500 mg by mouth 2 (two) times daily with meals.     . metoprolol  succinate (TOPROL  XL) 100 mg 24 hr tablet Take one tablet (100 mg dose) by mouth daily. 30 tablet 5  . multivitamin plain  (MULTIVITAMIN) TABS Take 1 tablet by mouth daily.     No current facility-administered medications for this visit.     Social History   Tobacco Use  Smoking Status Former Smoker  Smokeless Tobacco Never Used    ROS:  All systems reviewed and negative except for the above.  Physical Exam:  Vitals:   02/24/18 1535  BP: 124/66  Pulse: 67  SpO2: 93%  Weight: 201 lb (91.2 kg)  Height: 5' 10 (1.778 m)    Gen: Well developed, female in no apparent distress.  HENT: Normalcephalic, atraumatic, nose normal.  Eyes:  Pupils equal and round.  Conjunctiva normal, without discharge, sclera nonicteric.  Neck: Supple, no JVD, thyroidmegaly, or carotid bruits.  CV: S1-S2 regular rate and rhythm with out murmur.  Intact pulses. 2+ in upper extremities and 1+ in lower extremities.  Pulmonary/Chest: Normal effort.  Lungs clear.  No chest tenderness.  ABD: Positive bowel sounds, soft, nontender.  No guarding or masses.  Musculoskeletal:  Normal ROM without edema.  Neuro:  Awake, alert and oriented.  Skin: Warm, dry, intact.  No rashes.  Psych:  Normal mood/behavior.   Intrinsic Rhythm CHB, VP @ 30       DEVICE DATA   Battery Remain Time 89 mo     Charge Time (s) 4.013  Mode DDDR      LRL 60 beats/min     URL 130 beats/min      VF (bpm)    VT1 (bpm)    VT2 (bpm)     V paced 99.96 %     A paced 82.85 %     Mode Switches      Right Atrium Right Ventricle Left Ventricle  Sensing (mV) 4 mV     14.5 mV         Impedance (Ohms) 399 ohm     399 ohm     779 ohm  779 ohm  760 ohm  570 ohm  646 ohm  703 ohm  494 ohm  399 ohm  437 ohm  380 ohm  220.723   231.878   214.783   194.634   203.256        RA Volts RA ms RV Volts RV ms LV Volts LV ms  Threshold 0.5 V     0.4 ms     0.5 V     0.4 ms     1.125 V     0.4 ms      Output 1.5 V     0.4 ms     2 V     0.4 ms     2 V     0.4 ms        Device Check Data   Device Measurement   Tachycardia  ICD   Battery status RRT trigger 2.727      AT AF burden % (Recent) 0 %     ATP Delivered (Recent) 0       Battery remain %    VT / VF Events    Shocks Delivered (Recent) 0           Shocks Aborted (Recent) 0            DC - Batt, lead trends, and thresholds WNL; Episodes: none; Ind: CHB; Batt: 7.72yrs; Hist: okay; Site normal; Changes for testing only  Impression: 1. Nonischemic cardiomyopathy EF 35% in 11/2017   2. Essential hypertension   3. CHB (complete heart block) (*)   4. Nonocclusive coronary atherosclerosis of native coronary artery     Plan:    At this time Brandi Singleton appears to be in no distress.  Transmit every 3 months and follow-up in 6 months. She has no major complaints.  She will continue her current medical regimen,   Care plan and follow-up as discussed or as needed if any worsening symptoms or change in condition. Pt expressed understanding. No barriers to meeting goals. For any new medications, the patient/family were instructed about directions for and consequences of not taking medication. They were informed about the potential side effects and drug interactions.  The patient/family voices understanding of all medications. After visit summary given to patient.  This note was dictated with voice recognition software. Similar sounding words can inadvertently get transcribed and not corrected upon review.

## 2018-02-24 DIAGNOSIS — Z4502 Encounter for adjustment and management of automatic implantable cardiac defibrillator: Secondary | ICD-10-CM | POA: Diagnosis not present

## 2018-03-03 ENCOUNTER — Other Ambulatory Visit: Payer: Self-pay | Admitting: Family Medicine

## 2018-03-03 DIAGNOSIS — E119 Type 2 diabetes mellitus without complications: Secondary | ICD-10-CM | POA: Diagnosis not present

## 2018-03-03 DIAGNOSIS — Z1339 Encounter for screening examination for other mental health and behavioral disorders: Secondary | ICD-10-CM | POA: Diagnosis not present

## 2018-03-03 DIAGNOSIS — Z1231 Encounter for screening mammogram for malignant neoplasm of breast: Secondary | ICD-10-CM

## 2018-03-03 DIAGNOSIS — E039 Hypothyroidism, unspecified: Secondary | ICD-10-CM | POA: Diagnosis not present

## 2018-03-03 DIAGNOSIS — Z1331 Encounter for screening for depression: Secondary | ICD-10-CM | POA: Diagnosis not present

## 2018-03-03 DIAGNOSIS — Z0000 Encounter for general adult medical examination without abnormal findings: Secondary | ICD-10-CM | POA: Diagnosis not present

## 2018-03-03 DIAGNOSIS — Z950 Presence of cardiac pacemaker: Secondary | ICD-10-CM | POA: Diagnosis not present

## 2018-03-03 DIAGNOSIS — Z139 Encounter for screening, unspecified: Secondary | ICD-10-CM | POA: Diagnosis not present

## 2018-03-03 DIAGNOSIS — Z780 Asymptomatic menopausal state: Secondary | ICD-10-CM

## 2018-03-03 DIAGNOSIS — Z136 Encounter for screening for cardiovascular disorders: Secondary | ICD-10-CM | POA: Diagnosis not present

## 2018-03-03 DIAGNOSIS — Z9181 History of falling: Secondary | ICD-10-CM | POA: Diagnosis not present

## 2018-03-03 DIAGNOSIS — I10 Essential (primary) hypertension: Secondary | ICD-10-CM | POA: Diagnosis not present

## 2018-03-03 DIAGNOSIS — E785 Hyperlipidemia, unspecified: Secondary | ICD-10-CM | POA: Diagnosis not present

## 2018-03-15 ENCOUNTER — Ambulatory Visit (INDEPENDENT_AMBULATORY_CARE_PROVIDER_SITE_OTHER): Payer: Medicare Other

## 2018-03-15 DIAGNOSIS — Z780 Asymptomatic menopausal state: Secondary | ICD-10-CM

## 2018-03-15 DIAGNOSIS — M85852 Other specified disorders of bone density and structure, left thigh: Secondary | ICD-10-CM | POA: Diagnosis not present

## 2018-03-15 DIAGNOSIS — Z1231 Encounter for screening mammogram for malignant neoplasm of breast: Secondary | ICD-10-CM | POA: Diagnosis not present

## 2018-03-17 DIAGNOSIS — Y9301 Activity, walking, marching and hiking: Secondary | ICD-10-CM | POA: Diagnosis not present

## 2018-03-17 DIAGNOSIS — S76012A Strain of muscle, fascia and tendon of left hip, initial encounter: Secondary | ICD-10-CM | POA: Diagnosis not present

## 2018-03-17 DIAGNOSIS — W010XXA Fall on same level from slipping, tripping and stumbling without subsequent striking against object, initial encounter: Secondary | ICD-10-CM | POA: Diagnosis not present

## 2018-03-17 DIAGNOSIS — M25452 Effusion, left hip: Secondary | ICD-10-CM | POA: Diagnosis not present

## 2018-03-17 DIAGNOSIS — Y998 Other external cause status: Secondary | ICD-10-CM | POA: Diagnosis not present

## 2018-03-17 DIAGNOSIS — M25552 Pain in left hip: Secondary | ICD-10-CM | POA: Diagnosis not present

## 2018-03-17 NOTE — ED Triage Notes (Signed)
 Lt hip area pain s/p trip and fall pta--was seen at urgent care

## 2018-03-29 DIAGNOSIS — Z23 Encounter for immunization: Secondary | ICD-10-CM | POA: Diagnosis not present

## 2018-05-08 DIAGNOSIS — Z45018 Encounter for adjustment and management of other part of cardiac pacemaker: Secondary | ICD-10-CM | POA: Diagnosis not present

## 2018-05-08 DIAGNOSIS — Z45010 Encounter for checking and testing of cardiac pacemaker pulse generator [battery]: Secondary | ICD-10-CM | POA: Diagnosis not present

## 2018-07-17 DIAGNOSIS — S92351A Displaced fracture of fifth metatarsal bone, right foot, initial encounter for closed fracture: Secondary | ICD-10-CM | POA: Diagnosis not present

## 2018-08-10 DIAGNOSIS — S92351A Displaced fracture of fifth metatarsal bone, right foot, initial encounter for closed fracture: Secondary | ICD-10-CM | POA: Diagnosis not present

## 2018-08-15 DIAGNOSIS — H25093 Other age-related incipient cataract, bilateral: Secondary | ICD-10-CM | POA: Diagnosis not present

## 2018-08-15 DIAGNOSIS — E113293 Type 2 diabetes mellitus with mild nonproliferative diabetic retinopathy without macular edema, bilateral: Secondary | ICD-10-CM | POA: Diagnosis not present

## 2018-08-21 DIAGNOSIS — M25571 Pain in right ankle and joints of right foot: Secondary | ICD-10-CM | POA: Diagnosis not present

## 2018-08-21 DIAGNOSIS — R531 Weakness: Secondary | ICD-10-CM | POA: Diagnosis not present

## 2018-08-24 DIAGNOSIS — M79671 Pain in right foot: Secondary | ICD-10-CM | POA: Diagnosis not present

## 2018-08-24 DIAGNOSIS — M6281 Muscle weakness (generalized): Secondary | ICD-10-CM | POA: Diagnosis not present

## 2018-08-24 DIAGNOSIS — S92354D Nondisplaced fracture of fifth metatarsal bone, right foot, subsequent encounter for fracture with routine healing: Secondary | ICD-10-CM | POA: Diagnosis not present

## 2018-08-24 DIAGNOSIS — R262 Difficulty in walking, not elsewhere classified: Secondary | ICD-10-CM | POA: Diagnosis not present

## 2018-08-28 DIAGNOSIS — M6281 Muscle weakness (generalized): Secondary | ICD-10-CM | POA: Diagnosis not present

## 2018-08-28 DIAGNOSIS — M79671 Pain in right foot: Secondary | ICD-10-CM | POA: Diagnosis not present

## 2018-08-28 DIAGNOSIS — S92354D Nondisplaced fracture of fifth metatarsal bone, right foot, subsequent encounter for fracture with routine healing: Secondary | ICD-10-CM | POA: Diagnosis not present

## 2018-08-28 DIAGNOSIS — R262 Difficulty in walking, not elsewhere classified: Secondary | ICD-10-CM | POA: Diagnosis not present

## 2018-08-30 DIAGNOSIS — M6281 Muscle weakness (generalized): Secondary | ICD-10-CM | POA: Diagnosis not present

## 2018-08-30 DIAGNOSIS — S92354D Nondisplaced fracture of fifth metatarsal bone, right foot, subsequent encounter for fracture with routine healing: Secondary | ICD-10-CM | POA: Diagnosis not present

## 2018-08-30 DIAGNOSIS — M79671 Pain in right foot: Secondary | ICD-10-CM | POA: Diagnosis not present

## 2018-08-30 DIAGNOSIS — R262 Difficulty in walking, not elsewhere classified: Secondary | ICD-10-CM | POA: Diagnosis not present

## 2018-09-11 DIAGNOSIS — S92354D Nondisplaced fracture of fifth metatarsal bone, right foot, subsequent encounter for fracture with routine healing: Secondary | ICD-10-CM | POA: Diagnosis not present

## 2018-09-11 DIAGNOSIS — M6281 Muscle weakness (generalized): Secondary | ICD-10-CM | POA: Diagnosis not present

## 2018-09-11 DIAGNOSIS — R262 Difficulty in walking, not elsewhere classified: Secondary | ICD-10-CM | POA: Diagnosis not present

## 2018-09-11 DIAGNOSIS — M79671 Pain in right foot: Secondary | ICD-10-CM | POA: Diagnosis not present

## 2018-09-12 DIAGNOSIS — Z950 Presence of cardiac pacemaker: Secondary | ICD-10-CM | POA: Diagnosis not present

## 2018-09-12 DIAGNOSIS — E785 Hyperlipidemia, unspecified: Secondary | ICD-10-CM | POA: Diagnosis not present

## 2018-09-12 DIAGNOSIS — E039 Hypothyroidism, unspecified: Secondary | ICD-10-CM | POA: Diagnosis not present

## 2018-09-12 DIAGNOSIS — E119 Type 2 diabetes mellitus without complications: Secondary | ICD-10-CM | POA: Diagnosis not present

## 2018-09-12 DIAGNOSIS — I10 Essential (primary) hypertension: Secondary | ICD-10-CM | POA: Diagnosis not present

## 2018-09-13 DIAGNOSIS — S92354D Nondisplaced fracture of fifth metatarsal bone, right foot, subsequent encounter for fracture with routine healing: Secondary | ICD-10-CM | POA: Diagnosis not present

## 2018-09-13 DIAGNOSIS — R262 Difficulty in walking, not elsewhere classified: Secondary | ICD-10-CM | POA: Diagnosis not present

## 2018-09-13 DIAGNOSIS — M6281 Muscle weakness (generalized): Secondary | ICD-10-CM | POA: Diagnosis not present

## 2018-09-13 DIAGNOSIS — M79671 Pain in right foot: Secondary | ICD-10-CM | POA: Diagnosis not present

## 2018-09-20 DIAGNOSIS — M6281 Muscle weakness (generalized): Secondary | ICD-10-CM | POA: Diagnosis not present

## 2018-09-20 DIAGNOSIS — S92354D Nondisplaced fracture of fifth metatarsal bone, right foot, subsequent encounter for fracture with routine healing: Secondary | ICD-10-CM | POA: Diagnosis not present

## 2018-09-20 DIAGNOSIS — M79671 Pain in right foot: Secondary | ICD-10-CM | POA: Diagnosis not present

## 2018-09-20 DIAGNOSIS — R262 Difficulty in walking, not elsewhere classified: Secondary | ICD-10-CM | POA: Diagnosis not present

## 2018-09-26 DIAGNOSIS — S92351A Displaced fracture of fifth metatarsal bone, right foot, initial encounter for closed fracture: Secondary | ICD-10-CM | POA: Diagnosis not present

## 2018-09-26 DIAGNOSIS — M2011 Hallux valgus (acquired), right foot: Secondary | ICD-10-CM | POA: Diagnosis not present

## 2018-10-16 DIAGNOSIS — Z45010 Encounter for checking and testing of cardiac pacemaker pulse generator [battery]: Secondary | ICD-10-CM | POA: Diagnosis not present

## 2018-10-16 DIAGNOSIS — Z45018 Encounter for adjustment and management of other part of cardiac pacemaker: Secondary | ICD-10-CM | POA: Diagnosis not present

## 2018-10-26 DIAGNOSIS — I10 Essential (primary) hypertension: Secondary | ICD-10-CM | POA: Diagnosis not present

## 2018-10-26 DIAGNOSIS — I442 Atrioventricular block, complete: Secondary | ICD-10-CM | POA: Diagnosis not present

## 2018-10-26 DIAGNOSIS — I428 Other cardiomyopathies: Secondary | ICD-10-CM | POA: Diagnosis not present

## 2019-01-15 DIAGNOSIS — I442 Atrioventricular block, complete: Secondary | ICD-10-CM | POA: Diagnosis not present

## 2019-03-15 DIAGNOSIS — I10 Essential (primary) hypertension: Secondary | ICD-10-CM | POA: Diagnosis not present

## 2019-03-15 DIAGNOSIS — E039 Hypothyroidism, unspecified: Secondary | ICD-10-CM | POA: Diagnosis not present

## 2019-03-15 DIAGNOSIS — Z9181 History of falling: Secondary | ICD-10-CM | POA: Diagnosis not present

## 2019-03-15 DIAGNOSIS — Z139 Encounter for screening, unspecified: Secondary | ICD-10-CM | POA: Diagnosis not present

## 2019-03-15 DIAGNOSIS — E785 Hyperlipidemia, unspecified: Secondary | ICD-10-CM | POA: Diagnosis not present

## 2019-03-15 DIAGNOSIS — E119 Type 2 diabetes mellitus without complications: Secondary | ICD-10-CM | POA: Diagnosis not present

## 2019-03-15 DIAGNOSIS — Z950 Presence of cardiac pacemaker: Secondary | ICD-10-CM | POA: Diagnosis not present

## 2019-03-23 DIAGNOSIS — Z23 Encounter for immunization: Secondary | ICD-10-CM | POA: Diagnosis not present

## 2019-04-10 DIAGNOSIS — Z6827 Body mass index (BMI) 27.0-27.9, adult: Secondary | ICD-10-CM | POA: Diagnosis not present

## 2019-04-10 DIAGNOSIS — K580 Irritable bowel syndrome with diarrhea: Secondary | ICD-10-CM | POA: Diagnosis not present

## 2019-04-19 DIAGNOSIS — I428 Other cardiomyopathies: Secondary | ICD-10-CM | POA: Diagnosis not present

## 2019-04-19 DIAGNOSIS — I10 Essential (primary) hypertension: Secondary | ICD-10-CM | POA: Diagnosis not present

## 2019-04-19 DIAGNOSIS — I442 Atrioventricular block, complete: Secondary | ICD-10-CM | POA: Diagnosis not present

## 2019-04-26 DIAGNOSIS — Z1231 Encounter for screening mammogram for malignant neoplasm of breast: Secondary | ICD-10-CM | POA: Diagnosis not present

## 2019-04-30 DIAGNOSIS — E785 Hyperlipidemia, unspecified: Secondary | ICD-10-CM | POA: Diagnosis not present

## 2019-04-30 DIAGNOSIS — Z9181 History of falling: Secondary | ICD-10-CM | POA: Diagnosis not present

## 2019-04-30 DIAGNOSIS — Z1331 Encounter for screening for depression: Secondary | ICD-10-CM | POA: Diagnosis not present

## 2019-04-30 DIAGNOSIS — Z0000 Encounter for general adult medical examination without abnormal findings: Secondary | ICD-10-CM | POA: Diagnosis not present

## 2019-04-30 DIAGNOSIS — N959 Unspecified menopausal and perimenopausal disorder: Secondary | ICD-10-CM | POA: Diagnosis not present

## 2019-05-09 DIAGNOSIS — I517 Cardiomegaly: Secondary | ICD-10-CM | POA: Diagnosis not present

## 2019-05-09 DIAGNOSIS — I519 Heart disease, unspecified: Secondary | ICD-10-CM | POA: Diagnosis not present

## 2019-05-09 DIAGNOSIS — I5189 Other ill-defined heart diseases: Secondary | ICD-10-CM | POA: Diagnosis not present

## 2019-05-09 DIAGNOSIS — I083 Combined rheumatic disorders of mitral, aortic and tricuspid valves: Secondary | ICD-10-CM | POA: Diagnosis not present

## 2019-06-01 DIAGNOSIS — Z780 Asymptomatic menopausal state: Secondary | ICD-10-CM | POA: Diagnosis not present

## 2019-06-01 DIAGNOSIS — Z13820 Encounter for screening for osteoporosis: Secondary | ICD-10-CM | POA: Diagnosis not present

## 2019-06-01 DIAGNOSIS — M85879 Other specified disorders of bone density and structure, unspecified ankle and foot: Secondary | ICD-10-CM | POA: Diagnosis not present

## 2020-08-18 DIAGNOSIS — I442 Atrioventricular block, complete: Secondary | ICD-10-CM | POA: Diagnosis not present

## 2020-09-25 DIAGNOSIS — I10 Essential (primary) hypertension: Secondary | ICD-10-CM | POA: Diagnosis not present

## 2020-09-25 DIAGNOSIS — R413 Other amnesia: Secondary | ICD-10-CM | POA: Diagnosis not present

## 2020-09-25 DIAGNOSIS — E119 Type 2 diabetes mellitus without complications: Secondary | ICD-10-CM | POA: Diagnosis not present

## 2020-09-25 DIAGNOSIS — E039 Hypothyroidism, unspecified: Secondary | ICD-10-CM | POA: Diagnosis not present

## 2020-09-25 DIAGNOSIS — K580 Irritable bowel syndrome with diarrhea: Secondary | ICD-10-CM | POA: Diagnosis not present

## 2020-09-25 DIAGNOSIS — Z950 Presence of cardiac pacemaker: Secondary | ICD-10-CM | POA: Diagnosis not present

## 2020-09-25 DIAGNOSIS — Z6828 Body mass index (BMI) 28.0-28.9, adult: Secondary | ICD-10-CM | POA: Diagnosis not present

## 2020-09-25 DIAGNOSIS — Z789 Other specified health status: Secondary | ICD-10-CM | POA: Diagnosis not present

## 2020-09-25 DIAGNOSIS — E785 Hyperlipidemia, unspecified: Secondary | ICD-10-CM | POA: Diagnosis not present

## 2020-09-25 DIAGNOSIS — F331 Major depressive disorder, recurrent, moderate: Secondary | ICD-10-CM | POA: Diagnosis not present

## 2020-12-01 DIAGNOSIS — K580 Irritable bowel syndrome with diarrhea: Secondary | ICD-10-CM | POA: Diagnosis not present

## 2020-12-01 DIAGNOSIS — E119 Type 2 diabetes mellitus without complications: Secondary | ICD-10-CM | POA: Diagnosis not present

## 2020-12-01 DIAGNOSIS — E039 Hypothyroidism, unspecified: Secondary | ICD-10-CM | POA: Diagnosis not present

## 2020-12-01 DIAGNOSIS — Z6828 Body mass index (BMI) 28.0-28.9, adult: Secondary | ICD-10-CM | POA: Diagnosis not present

## 2020-12-01 DIAGNOSIS — Z9181 History of falling: Secondary | ICD-10-CM | POA: Diagnosis not present

## 2020-12-01 DIAGNOSIS — Z789 Other specified health status: Secondary | ICD-10-CM | POA: Diagnosis not present

## 2020-12-01 DIAGNOSIS — R413 Other amnesia: Secondary | ICD-10-CM | POA: Diagnosis not present

## 2020-12-01 DIAGNOSIS — I10 Essential (primary) hypertension: Secondary | ICD-10-CM | POA: Diagnosis not present

## 2020-12-01 DIAGNOSIS — E785 Hyperlipidemia, unspecified: Secondary | ICD-10-CM | POA: Diagnosis not present

## 2020-12-01 DIAGNOSIS — Z950 Presence of cardiac pacemaker: Secondary | ICD-10-CM | POA: Diagnosis not present

## 2020-12-01 DIAGNOSIS — F331 Major depressive disorder, recurrent, moderate: Secondary | ICD-10-CM | POA: Diagnosis not present

## 2020-12-20 DIAGNOSIS — S92902B Unspecified fracture of left foot, initial encounter for open fracture: Secondary | ICD-10-CM | POA: Diagnosis not present

## 2020-12-24 DIAGNOSIS — I10 Essential (primary) hypertension: Secondary | ICD-10-CM | POA: Diagnosis not present

## 2020-12-24 DIAGNOSIS — I472 Ventricular tachycardia: Secondary | ICD-10-CM | POA: Diagnosis not present

## 2020-12-24 DIAGNOSIS — I442 Atrioventricular block, complete: Secondary | ICD-10-CM | POA: Diagnosis not present

## 2020-12-24 DIAGNOSIS — I428 Other cardiomyopathies: Secondary | ICD-10-CM | POA: Diagnosis not present

## 2021-03-30 DIAGNOSIS — I442 Atrioventricular block, complete: Secondary | ICD-10-CM | POA: Diagnosis not present

## 2021-04-09 DIAGNOSIS — Z23 Encounter for immunization: Secondary | ICD-10-CM | POA: Diagnosis not present

## 2021-05-18 DIAGNOSIS — Z23 Encounter for immunization: Secondary | ICD-10-CM | POA: Diagnosis not present

## 2021-06-29 DIAGNOSIS — I442 Atrioventricular block, complete: Secondary | ICD-10-CM | POA: Diagnosis not present

## 2021-07-30 DIAGNOSIS — Z6828 Body mass index (BMI) 28.0-28.9, adult: Secondary | ICD-10-CM | POA: Diagnosis not present

## 2021-07-30 DIAGNOSIS — E039 Hypothyroidism, unspecified: Secondary | ICD-10-CM | POA: Diagnosis not present

## 2021-07-30 DIAGNOSIS — E119 Type 2 diabetes mellitus without complications: Secondary | ICD-10-CM | POA: Diagnosis not present

## 2021-07-30 DIAGNOSIS — I10 Essential (primary) hypertension: Secondary | ICD-10-CM | POA: Diagnosis not present

## 2021-07-30 DIAGNOSIS — E785 Hyperlipidemia, unspecified: Secondary | ICD-10-CM | POA: Diagnosis not present

## 2021-07-30 DIAGNOSIS — Z139 Encounter for screening, unspecified: Secondary | ICD-10-CM | POA: Diagnosis not present

## 2021-07-30 DIAGNOSIS — Z950 Presence of cardiac pacemaker: Secondary | ICD-10-CM | POA: Diagnosis not present

## 2021-07-30 DIAGNOSIS — F32A Depression, unspecified: Secondary | ICD-10-CM | POA: Diagnosis not present

## 2021-07-30 DIAGNOSIS — R159 Full incontinence of feces: Secondary | ICD-10-CM | POA: Diagnosis not present

## 2021-09-03 DIAGNOSIS — E039 Hypothyroidism, unspecified: Secondary | ICD-10-CM | POA: Diagnosis not present

## 2021-09-03 DIAGNOSIS — F331 Major depressive disorder, recurrent, moderate: Secondary | ICD-10-CM | POA: Diagnosis not present

## 2021-09-03 DIAGNOSIS — E785 Hyperlipidemia, unspecified: Secondary | ICD-10-CM | POA: Diagnosis not present

## 2021-09-03 DIAGNOSIS — Z6827 Body mass index (BMI) 27.0-27.9, adult: Secondary | ICD-10-CM | POA: Diagnosis not present

## 2021-09-03 DIAGNOSIS — I10 Essential (primary) hypertension: Secondary | ICD-10-CM | POA: Diagnosis not present

## 2021-09-03 DIAGNOSIS — E119 Type 2 diabetes mellitus without complications: Secondary | ICD-10-CM | POA: Diagnosis not present

## 2021-09-10 DIAGNOSIS — R197 Diarrhea, unspecified: Secondary | ICD-10-CM | POA: Diagnosis not present

## 2021-09-10 DIAGNOSIS — R195 Other fecal abnormalities: Secondary | ICD-10-CM | POA: Diagnosis not present

## 2021-09-10 DIAGNOSIS — R159 Full incontinence of feces: Secondary | ICD-10-CM | POA: Diagnosis not present

## 2021-09-28 DIAGNOSIS — I442 Atrioventricular block, complete: Secondary | ICD-10-CM | POA: Diagnosis not present

## 2021-12-28 DIAGNOSIS — I428 Other cardiomyopathies: Secondary | ICD-10-CM | POA: Diagnosis not present

## 2022-01-20 DIAGNOSIS — I10 Essential (primary) hypertension: Secondary | ICD-10-CM | POA: Diagnosis not present

## 2022-01-20 DIAGNOSIS — I2510 Atherosclerotic heart disease of native coronary artery without angina pectoris: Secondary | ICD-10-CM | POA: Diagnosis not present

## 2022-01-20 DIAGNOSIS — I428 Other cardiomyopathies: Secondary | ICD-10-CM | POA: Diagnosis not present

## 2022-01-20 DIAGNOSIS — I442 Atrioventricular block, complete: Secondary | ICD-10-CM | POA: Diagnosis not present

## 2022-03-10 DIAGNOSIS — E039 Hypothyroidism, unspecified: Secondary | ICD-10-CM | POA: Diagnosis not present

## 2022-03-10 DIAGNOSIS — E46 Unspecified protein-calorie malnutrition: Secondary | ICD-10-CM | POA: Diagnosis not present

## 2022-03-10 DIAGNOSIS — Z79899 Other long term (current) drug therapy: Secondary | ICD-10-CM | POA: Diagnosis not present

## 2022-03-10 DIAGNOSIS — N1832 Chronic kidney disease, stage 3b: Secondary | ICD-10-CM | POA: Diagnosis not present

## 2022-03-10 DIAGNOSIS — E785 Hyperlipidemia, unspecified: Secondary | ICD-10-CM | POA: Diagnosis not present

## 2022-03-10 DIAGNOSIS — E119 Type 2 diabetes mellitus without complications: Secondary | ICD-10-CM | POA: Diagnosis not present

## 2022-03-10 DIAGNOSIS — F331 Major depressive disorder, recurrent, moderate: Secondary | ICD-10-CM | POA: Diagnosis not present

## 2022-03-10 DIAGNOSIS — Z6823 Body mass index (BMI) 23.0-23.9, adult: Secondary | ICD-10-CM | POA: Diagnosis not present

## 2022-03-10 DIAGNOSIS — I10 Essential (primary) hypertension: Secondary | ICD-10-CM | POA: Diagnosis not present

## 2022-03-10 DIAGNOSIS — R413 Other amnesia: Secondary | ICD-10-CM | POA: Diagnosis not present

## 2022-03-31 DIAGNOSIS — R413 Other amnesia: Secondary | ICD-10-CM | POA: Diagnosis not present

## 2022-03-31 DIAGNOSIS — Z95810 Presence of automatic (implantable) cardiac defibrillator: Secondary | ICD-10-CM | POA: Diagnosis not present

## 2022-03-31 DIAGNOSIS — R2681 Unsteadiness on feet: Secondary | ICD-10-CM | POA: Diagnosis not present

## 2022-06-15 DIAGNOSIS — I10 Essential (primary) hypertension: Secondary | ICD-10-CM | POA: Diagnosis not present

## 2022-06-15 DIAGNOSIS — F0390 Unspecified dementia without behavioral disturbance: Secondary | ICD-10-CM | POA: Diagnosis not present

## 2022-06-15 DIAGNOSIS — N1832 Chronic kidney disease, stage 3b: Secondary | ICD-10-CM | POA: Diagnosis not present

## 2022-06-15 DIAGNOSIS — E039 Hypothyroidism, unspecified: Secondary | ICD-10-CM | POA: Diagnosis not present

## 2022-06-15 DIAGNOSIS — E119 Type 2 diabetes mellitus without complications: Secondary | ICD-10-CM | POA: Diagnosis not present

## 2022-06-15 DIAGNOSIS — F331 Major depressive disorder, recurrent, moderate: Secondary | ICD-10-CM | POA: Diagnosis not present

## 2022-06-15 DIAGNOSIS — E785 Hyperlipidemia, unspecified: Secondary | ICD-10-CM | POA: Diagnosis not present

## 2022-06-15 DIAGNOSIS — Z23 Encounter for immunization: Secondary | ICD-10-CM | POA: Diagnosis not present

## 2022-06-15 DIAGNOSIS — Z9181 History of falling: Secondary | ICD-10-CM | POA: Diagnosis not present

## 2022-11-18 DIAGNOSIS — I442 Atrioventricular block, complete: Secondary | ICD-10-CM | POA: Diagnosis not present

## 2022-11-18 DIAGNOSIS — I10 Essential (primary) hypertension: Secondary | ICD-10-CM | POA: Diagnosis not present

## 2022-11-18 DIAGNOSIS — Z1330 Encounter for screening examination for mental health and behavioral disorders, unspecified: Secondary | ICD-10-CM | POA: Diagnosis not present

## 2022-11-18 DIAGNOSIS — I2510 Atherosclerotic heart disease of native coronary artery without angina pectoris: Secondary | ICD-10-CM | POA: Diagnosis not present

## 2022-11-18 DIAGNOSIS — R0602 Shortness of breath: Secondary | ICD-10-CM | POA: Diagnosis not present

## 2022-11-18 DIAGNOSIS — I428 Other cardiomyopathies: Secondary | ICD-10-CM | POA: Diagnosis not present

## 2022-12-23 DIAGNOSIS — R0602 Shortness of breath: Secondary | ICD-10-CM | POA: Diagnosis not present

## 2022-12-28 DIAGNOSIS — I10 Essential (primary) hypertension: Secondary | ICD-10-CM | POA: Diagnosis not present

## 2022-12-28 DIAGNOSIS — E039 Hypothyroidism, unspecified: Secondary | ICD-10-CM | POA: Diagnosis not present

## 2022-12-28 DIAGNOSIS — N1832 Chronic kidney disease, stage 3b: Secondary | ICD-10-CM | POA: Diagnosis not present

## 2022-12-28 DIAGNOSIS — Z139 Encounter for screening, unspecified: Secondary | ICD-10-CM | POA: Diagnosis not present

## 2022-12-28 DIAGNOSIS — F0390 Unspecified dementia without behavioral disturbance: Secondary | ICD-10-CM | POA: Diagnosis not present

## 2022-12-28 DIAGNOSIS — E119 Type 2 diabetes mellitus without complications: Secondary | ICD-10-CM | POA: Diagnosis not present

## 2022-12-28 DIAGNOSIS — F331 Major depressive disorder, recurrent, moderate: Secondary | ICD-10-CM | POA: Diagnosis not present

## 2022-12-28 DIAGNOSIS — E785 Hyperlipidemia, unspecified: Secondary | ICD-10-CM | POA: Diagnosis not present

## 2023-02-07 DIAGNOSIS — H9313 Tinnitus, bilateral: Secondary | ICD-10-CM | POA: Diagnosis not present

## 2023-02-07 DIAGNOSIS — R410 Disorientation, unspecified: Secondary | ICD-10-CM | POA: Diagnosis not present

## 2023-02-07 DIAGNOSIS — H6123 Impacted cerumen, bilateral: Secondary | ICD-10-CM | POA: Diagnosis not present

## 2023-02-21 DIAGNOSIS — Z95810 Presence of automatic (implantable) cardiac defibrillator: Secondary | ICD-10-CM | POA: Diagnosis not present

## 2023-02-21 DIAGNOSIS — I429 Cardiomyopathy, unspecified: Secondary | ICD-10-CM | POA: Diagnosis not present

## 2023-04-02 DIAGNOSIS — S0101XA Laceration without foreign body of scalp, initial encounter: Secondary | ICD-10-CM | POA: Diagnosis not present

## 2023-04-02 DIAGNOSIS — T148XXA Other injury of unspecified body region, initial encounter: Secondary | ICD-10-CM | POA: Diagnosis not present

## 2023-04-09 DIAGNOSIS — Z4802 Encounter for removal of sutures: Secondary | ICD-10-CM | POA: Diagnosis not present

## 2023-05-24 DIAGNOSIS — I429 Cardiomyopathy, unspecified: Secondary | ICD-10-CM | POA: Diagnosis not present

## 2023-05-24 DIAGNOSIS — Z95810 Presence of automatic (implantable) cardiac defibrillator: Secondary | ICD-10-CM | POA: Diagnosis not present

## 2023-07-01 DIAGNOSIS — F0390 Unspecified dementia without behavioral disturbance: Secondary | ICD-10-CM | POA: Diagnosis not present

## 2023-07-01 DIAGNOSIS — E785 Hyperlipidemia, unspecified: Secondary | ICD-10-CM | POA: Diagnosis not present

## 2023-07-01 DIAGNOSIS — E039 Hypothyroidism, unspecified: Secondary | ICD-10-CM | POA: Diagnosis not present

## 2023-07-01 DIAGNOSIS — G319 Degenerative disease of nervous system, unspecified: Secondary | ICD-10-CM | POA: Diagnosis not present

## 2023-07-01 DIAGNOSIS — Z23 Encounter for immunization: Secondary | ICD-10-CM | POA: Diagnosis not present

## 2023-07-01 DIAGNOSIS — I10 Essential (primary) hypertension: Secondary | ICD-10-CM | POA: Diagnosis not present

## 2023-07-01 DIAGNOSIS — Z9181 History of falling: Secondary | ICD-10-CM | POA: Diagnosis not present

## 2023-07-01 DIAGNOSIS — E119 Type 2 diabetes mellitus without complications: Secondary | ICD-10-CM | POA: Diagnosis not present

## 2023-07-01 DIAGNOSIS — F331 Major depressive disorder, recurrent, moderate: Secondary | ICD-10-CM | POA: Diagnosis not present

## 2023-07-01 DIAGNOSIS — N1832 Chronic kidney disease, stage 3b: Secondary | ICD-10-CM | POA: Diagnosis not present

## 2023-07-26 DIAGNOSIS — R52 Pain, unspecified: Secondary | ICD-10-CM | POA: Diagnosis not present

## 2023-08-23 DIAGNOSIS — Z95810 Presence of automatic (implantable) cardiac defibrillator: Secondary | ICD-10-CM | POA: Diagnosis not present

## 2023-08-23 DIAGNOSIS — I442 Atrioventricular block, complete: Secondary | ICD-10-CM | POA: Diagnosis not present

## 2023-09-12 DIAGNOSIS — R451 Restlessness and agitation: Secondary | ICD-10-CM | POA: Diagnosis not present

## 2023-09-12 DIAGNOSIS — R413 Other amnesia: Secondary | ICD-10-CM | POA: Diagnosis not present

## 2023-09-12 DIAGNOSIS — F32A Depression, unspecified: Secondary | ICD-10-CM | POA: Diagnosis not present

## 2023-09-12 DIAGNOSIS — F03B18 Unspecified dementia, moderate, with other behavioral disturbance: Secondary | ICD-10-CM | POA: Diagnosis not present

## 2023-09-12 DIAGNOSIS — R2689 Other abnormalities of gait and mobility: Secondary | ICD-10-CM | POA: Diagnosis not present

## 2023-11-22 DIAGNOSIS — I429 Cardiomyopathy, unspecified: Secondary | ICD-10-CM | POA: Diagnosis not present

## 2023-11-22 DIAGNOSIS — I442 Atrioventricular block, complete: Secondary | ICD-10-CM | POA: Diagnosis not present

## 2023-11-22 DIAGNOSIS — Z95810 Presence of automatic (implantable) cardiac defibrillator: Secondary | ICD-10-CM | POA: Diagnosis not present

## 2023-12-13 DIAGNOSIS — F32A Depression, unspecified: Secondary | ICD-10-CM | POA: Diagnosis not present

## 2023-12-13 DIAGNOSIS — E1169 Type 2 diabetes mellitus with other specified complication: Secondary | ICD-10-CM | POA: Diagnosis not present

## 2023-12-13 DIAGNOSIS — R4583 Excessive crying of child, adolescent or adult: Secondary | ICD-10-CM | POA: Diagnosis not present

## 2023-12-13 DIAGNOSIS — Z1331 Encounter for screening for depression: Secondary | ICD-10-CM | POA: Diagnosis not present

## 2023-12-13 DIAGNOSIS — R2689 Other abnormalities of gait and mobility: Secondary | ICD-10-CM | POA: Diagnosis not present

## 2023-12-13 DIAGNOSIS — E669 Obesity, unspecified: Secondary | ICD-10-CM | POA: Diagnosis not present

## 2023-12-13 DIAGNOSIS — R413 Other amnesia: Secondary | ICD-10-CM | POA: Diagnosis not present

## 2023-12-13 DIAGNOSIS — R451 Restlessness and agitation: Secondary | ICD-10-CM | POA: Diagnosis not present

## 2024-01-04 DIAGNOSIS — Z139 Encounter for screening, unspecified: Secondary | ICD-10-CM | POA: Diagnosis not present

## 2024-01-04 DIAGNOSIS — I4729 Other ventricular tachycardia: Secondary | ICD-10-CM | POA: Diagnosis not present

## 2024-01-04 DIAGNOSIS — E1169 Type 2 diabetes mellitus with other specified complication: Secondary | ICD-10-CM | POA: Diagnosis not present

## 2024-01-04 DIAGNOSIS — I428 Other cardiomyopathies: Secondary | ICD-10-CM | POA: Diagnosis not present

## 2024-01-04 DIAGNOSIS — Z950 Presence of cardiac pacemaker: Secondary | ICD-10-CM | POA: Diagnosis not present

## 2024-01-04 DIAGNOSIS — F0390 Unspecified dementia without behavioral disturbance: Secondary | ICD-10-CM | POA: Diagnosis not present

## 2024-01-04 DIAGNOSIS — I10 Essential (primary) hypertension: Secondary | ICD-10-CM | POA: Diagnosis not present

## 2024-01-04 DIAGNOSIS — E039 Hypothyroidism, unspecified: Secondary | ICD-10-CM | POA: Diagnosis not present

## 2024-01-04 DIAGNOSIS — F331 Major depressive disorder, recurrent, moderate: Secondary | ICD-10-CM | POA: Diagnosis not present

## 2024-01-04 DIAGNOSIS — N1832 Chronic kidney disease, stage 3b: Secondary | ICD-10-CM | POA: Diagnosis not present

## 2024-01-04 DIAGNOSIS — E785 Hyperlipidemia, unspecified: Secondary | ICD-10-CM | POA: Diagnosis not present

## 2024-02-02 DIAGNOSIS — I442 Atrioventricular block, complete: Secondary | ICD-10-CM | POA: Diagnosis not present

## 2024-02-02 DIAGNOSIS — Z950 Presence of cardiac pacemaker: Secondary | ICD-10-CM | POA: Diagnosis not present

## 2024-02-02 DIAGNOSIS — R001 Bradycardia, unspecified: Secondary | ICD-10-CM | POA: Diagnosis not present

## 2024-02-02 DIAGNOSIS — I2510 Atherosclerotic heart disease of native coronary artery without angina pectoris: Secondary | ICD-10-CM | POA: Diagnosis not present

## 2024-02-02 DIAGNOSIS — I4729 Other ventricular tachycardia: Secondary | ICD-10-CM | POA: Diagnosis not present

## 2024-02-02 DIAGNOSIS — I428 Other cardiomyopathies: Secondary | ICD-10-CM | POA: Diagnosis not present

## 2024-04-04 DIAGNOSIS — Z950 Presence of cardiac pacemaker: Secondary | ICD-10-CM | POA: Diagnosis not present

## 2024-04-20 DIAGNOSIS — Z45010 Encounter for checking and testing of cardiac pacemaker pulse generator [battery]: Secondary | ICD-10-CM | POA: Diagnosis not present

## 2024-04-20 DIAGNOSIS — I10 Essential (primary) hypertension: Secondary | ICD-10-CM | POA: Diagnosis not present

## 2024-04-20 DIAGNOSIS — I2510 Atherosclerotic heart disease of native coronary artery without angina pectoris: Secondary | ICD-10-CM | POA: Diagnosis not present

## 2024-04-20 DIAGNOSIS — I428 Other cardiomyopathies: Secondary | ICD-10-CM | POA: Diagnosis not present

## 2024-04-20 DIAGNOSIS — Z79899 Other long term (current) drug therapy: Secondary | ICD-10-CM | POA: Diagnosis not present

## 2024-04-20 DIAGNOSIS — I4729 Other ventricular tachycardia: Secondary | ICD-10-CM | POA: Diagnosis not present

## 2024-04-20 NOTE — Progress Notes (Signed)
 Established patient Visit    Chief Complaint: ERI of ICD Chief Complaint  Patient presents with  . Follow-up    Battery ERI    Date of Service: 04/20/2024 Date of Birth: 1940-09-30 PCP: Stephanie Rocher, MD  History of Present Illness: Brandi Singleton is a 83 y.o.female patient who presented for ER ICD  She followed with [facility] cardiology and established with me 01/2024.  Past medical history significant for complete heart block status post pacemaker with upgrade to BiV ICD in 2019, nonischemic cardiomyopathy, nonobstructive CAD-heart cath in 2016 with mild CAD, hypertension, NSVT.  Last device check 12/2023 with no events. Device longevity 4 months.  Echocardiogram 12/2022 with LVEF of 35 to 40%, mild to moderate MR  She is accompanied by her daughter to visit.  Does usual household activities with no complaints of chest pain/pressure or increased shortness of breath.  No palpitation, dizziness or syncope.  Had recent mechanical fall with bruise on left anterior chest which is improving  Past Medical and Surgical History  Past Medical History Past Medical History:  Diagnosis Date  . Arthritis   . Bowel incontinence   . CHB (complete heart block) (CMS/HHS-HCC)   . Depression   . Diabetes mellitus, type 2 (CMS/HHS-HCC)   . Elevated cholesterol   . Elevated diaphragm   . Estrogen deficiency   . Hyperlipidemia   . Hypertension   . Hypertension   . IBS (irritable bowel syndrome)   . NICM (nonischemic cardiomyopathy) (CMS/HHS-HCC) 2019  . NSVT (nonsustained ventricular tachycardia) (CMS/HHS-HCC)   . Pacemaker   . Stage 3a chronic kidney disease (CKD) (CMS-HCC)   . Xanthelasma of right eyelid     Past Surgical History She has a past surgical history that includes insertion dual chamber pacemaker generator and Hysterectomy Vaginal (1975).   Medications and Allergies  Current Medications Current Outpatient Medications  Medication Sig Dispense Refill  . aspirin-acetaminophen-caffeine  (EXCEDRIN EXTRA STRENGTH) 250-250-65 mg per tablet Take 1 tablet by mouth every 6 (six) hours as needed for Pain    . atorvastatin  (LIPITOR) 80 MG tablet Take 80 mg by mouth once daily    . buPROPion (WELLBUTRIN XL) 300 MG XL tablet Take 300 mg by mouth once daily    . escitalopram  oxalate (LEXAPRO ) 20 MG tablet Take 20 mg by mouth once daily    . galantamine (RAZADYNE) 4 MG tablet TAKE 1 TABLET BY MOUTH TWICE A DAY WITH FOOD 180 tablet 0  . levothyroxine  (SYNTHROID ) 75 MCG tablet Take 75 mcg by mouth once daily    . losartan (COZAAR) 25 MG tablet Take 0.5 tablets (12.5 mg total) by mouth once daily 15 tablet 11  . memantine (NAMENDA XR) 28 mg CSpX Take by mouth    . metoprolol  SUCCinate (TOPROL -XL) 100 MG XL tablet Take 100 mg by mouth once daily    . buPROPion (WELLBUTRIN XL) 150 MG XL tablet Take 450 mg by mouth once daily (Patient not taking: Reported on 12/13/2023)     No current facility-administered medications for this visit.    Allergies Hydrocodone  Social and Family History  Social History  reports that she has quit smoking. Her smoking use included cigarettes. She has never used smokeless tobacco. She reports that she does not currently use alcohol. She reports that she does not use drugs.  Family History family history includes Heart disease in her mother; Heart failure in her father.   Review of Systems   Review of Systems: The patient denies chest pain, shortness  of breath, orthopnea, paroxysmal nocturnal dyspnea, pedal edema, palpitations, heart racing, presyncope, syncope.    Physical Examination   Vitals:BP 122/74   Pulse 62   Ht 177.8 cm (5' 10)   Wt 79.9 kg (176 lb 3.2 oz)   SpO2 92%   BMI 25.28 kg/m  Ht:177.8 cm (5' 10) Wt:79.9 kg (176 lb 3.2 oz) ADJ:Anib surface area is 1.99 meters squared. Body mass index is 25.28 kg/m.  HEENT: Pupils equally reactive to light and accomodation  Neck: Supple, no significant JVD Lungs: clear to auscultation bilaterally;  no wheezes, rales, rhonchi Heart: Regular rate and rhythm. No murmur Extremities: Bruises noted on left anterior chest secondary to recent mechanical fall  Assessment and Plan   83 y.o. female with  ERI of ICD Nonischemic cardiomyopathy Mild CAD heart cath 2016 Complete heart block status post pacemaker and later upgrade to BiV ICD History of NSVT, hypertension  Stable from cardiac standpoint.  Euvolemic on exam Blood pressure well-controlled. Continue aspirin, statin Regarding GDMT, continue Toprol -XL and losartan.  Will not further uptitrate with history of intermittent soft blood pressure She will be scheduled for generator change as her ICD has reached ERI  Orders Placed This Encounter  Procedures  . X-ray chest PA and lateral  . Basic Metabolic Panel (BMP)  . CBC w/auto Differential (5 Part)    Return in about 4 months (around 08/21/2024).  KRISHNA CHAITANYA ALLURI, MD  This dictation was prepared with dragon dictation. Any transcription errors that result from this process are unintentional.

## 2024-05-08 DIAGNOSIS — I10 Essential (primary) hypertension: Secondary | ICD-10-CM | POA: Diagnosis not present

## 2024-05-08 DIAGNOSIS — E785 Hyperlipidemia, unspecified: Secondary | ICD-10-CM | POA: Diagnosis not present

## 2024-05-08 DIAGNOSIS — E7800 Pure hypercholesterolemia, unspecified: Secondary | ICD-10-CM | POA: Diagnosis not present

## 2024-05-22 ENCOUNTER — Encounter
Admission: RE | Disposition: A | Discharge status: Home / Self Care | Payer: Self-pay | Source: Home / Self Care | Attending: Cardiology

## 2024-05-22 ENCOUNTER — Ambulatory Visit
Admission: RE | Admit: 2024-05-22 | Discharge: 2024-05-22 | Disposition: A | Discharge status: Home / Self Care | Attending: Cardiology | Admitting: Cardiology

## 2024-05-22 ENCOUNTER — Encounter: Payer: Self-pay | Admitting: Cardiology

## 2024-05-22 DIAGNOSIS — Z79899 Other long term (current) drug therapy: Secondary | ICD-10-CM | POA: Insufficient documentation

## 2024-05-22 DIAGNOSIS — I472 Ventricular tachycardia, unspecified: Secondary | ICD-10-CM | POA: Insufficient documentation

## 2024-05-22 DIAGNOSIS — I442 Atrioventricular block, complete: Secondary | ICD-10-CM | POA: Diagnosis not present

## 2024-05-22 DIAGNOSIS — I10 Essential (primary) hypertension: Secondary | ICD-10-CM | POA: Diagnosis not present

## 2024-05-22 DIAGNOSIS — I428 Other cardiomyopathies: Secondary | ICD-10-CM | POA: Insufficient documentation

## 2024-05-22 DIAGNOSIS — I2510 Atherosclerotic heart disease of native coronary artery without angina pectoris: Secondary | ICD-10-CM | POA: Insufficient documentation

## 2024-05-22 DIAGNOSIS — Z4502 Encounter for adjustment and management of automatic implantable cardiac defibrillator: Secondary | ICD-10-CM | POA: Diagnosis not present

## 2024-05-22 DIAGNOSIS — Z45010 Encounter for checking and testing of cardiac pacemaker pulse generator [battery]: Secondary | ICD-10-CM

## 2024-05-22 DIAGNOSIS — I5023 Acute on chronic systolic (congestive) heart failure: Secondary | ICD-10-CM | POA: Diagnosis not present

## 2024-05-22 HISTORY — PX: PPM GENERATOR CHANGEOUT: EP1233

## 2024-05-22 SURGERY — PPM GENERATOR CHANGEOUT
Anesthesia: Moderate Sedation

## 2024-05-22 MED ORDER — SODIUM CHLORIDE 0.9 % IV SOLN
80.0000 mg | INTRAVENOUS | Status: DC
  Filled 2024-05-22: qty 2

## 2024-05-22 MED ORDER — LIDOCAINE HCL 1 % IJ SOLN
INTRAMUSCULAR | Status: AC
  Filled 2024-05-22: qty 60

## 2024-05-22 MED ORDER — FENTANYL CITRATE (PF) 100 MCG/2ML IJ SOLN
INTRAMUSCULAR | Status: AC
  Filled 2024-05-22: qty 2

## 2024-05-22 MED ORDER — CEPHALEXIN 500 MG PO CAPS: 500.0000 mg | ORAL_CAPSULE | Freq: Two times a day (BID) | ORAL | 0 refills | Status: AC

## 2024-05-22 MED ORDER — ONDANSETRON HCL 4 MG/2ML IJ SOLN: 4.0000 mg | Freq: Four times a day (QID) | INTRAMUSCULAR | Status: DC | PRN

## 2024-05-22 MED ORDER — MIDAZOLAM HCL (PF) 2 MG/2ML IJ SOLN
INTRAMUSCULAR | Status: DC | PRN
  Administered 2024-05-22: .5 mg via INTRAVENOUS

## 2024-05-22 MED ORDER — HEPARIN (PORCINE) IN NACL 1000-0.9 UT/500ML-% IV SOLN
INTRAVENOUS | Status: AC
Start: 2024-05-22 — End: 2024-05-22
  Filled 2024-05-22: qty 500

## 2024-05-22 MED ORDER — CEFAZOLIN SODIUM-DEXTROSE 2-4 GM/100ML-% IV SOLN: 2.0000 g | INTRAVENOUS | Status: DC

## 2024-05-22 MED ORDER — HEPARIN (PORCINE) IN NACL 1000-0.9 UT/500ML-% IV SOLN
INTRAVENOUS | Status: DC | PRN
  Administered 2024-05-22: 500 mL

## 2024-05-22 MED ORDER — SODIUM CHLORIDE 0.9 % IV SOLN
INTRAVENOUS | Status: DC | PRN
  Administered 2024-05-22: 80 mg

## 2024-05-22 MED ORDER — FENTANYL CITRATE (PF) 100 MCG/2ML IJ SOLN
INTRAMUSCULAR | Status: DC | PRN
  Administered 2024-05-22: 12.5 ug via INTRAVENOUS

## 2024-05-22 MED ORDER — POVIDONE-IODINE 10 % EX SWAB: 2.0000 | Freq: Once | CUTANEOUS | Status: DC

## 2024-05-22 MED ORDER — LIDOCAINE HCL (PF) 1 % IJ SOLN
INTRAMUSCULAR | Status: DC | PRN
  Administered 2024-05-22: 30 mL

## 2024-05-22 MED ORDER — CEFAZOLIN SODIUM-DEXTROSE 2-4 GM/100ML-% IV SOLN
INTRAVENOUS | Status: AC
  Filled 2024-05-22: qty 100

## 2024-05-22 MED ORDER — CEFAZOLIN SODIUM-DEXTROSE 1-4 GM/50ML-% IV SOLN
INTRAVENOUS | Status: DC | PRN
  Administered 2024-05-22: 2 g via INTRAVENOUS

## 2024-05-22 MED ORDER — MIDAZOLAM HCL 2 MG/2ML IJ SOLN
INTRAMUSCULAR | Status: AC
  Filled 2024-05-22: qty 2

## 2024-05-22 MED ORDER — SODIUM CHLORIDE 0.9 % IV SOLN: INTRAVENOUS | Status: DC

## 2024-05-22 SURGICAL SUPPLY — 10 items
CABLE SURG 12 DISP A/V CHANNEL (MISCELLANEOUS) IMPLANT
DEVICE DSSCT PLSMBLD 3.0S LGHT (MISCELLANEOUS) IMPLANT
DRAPE INCISE 23X17 STRL (DRAPES) IMPLANT
ICD COBALT XT QUAD CRT DTPA2QQ (ICD Generator) IMPLANT
KIT SYRINGE INJ CVI SPIKEX1 (MISCELLANEOUS) IMPLANT
PAD ELECT DEFIB RADIOL ZOLL (MISCELLANEOUS) IMPLANT
POUCH AIGIS-R ANTIBACT ICD LRG (Mesh General) IMPLANT
SUT VIC AB 2-0 CT2 27 (SUTURE) IMPLANT
SUT VIC AB 4-0 PS2 18 (SUTURE) IMPLANT
TRAY PACEMAKER INSERTION (PACKS) ×1 IMPLANT

## 2024-05-22 NOTE — H&P (Signed)
 [facility] CLINIC CARDIOLOGY HISTORY & PHYSICAL        Patient ID: Brandi Singleton MRN: [id number] DOB/AGE: [birth year] 83 y.o. Admit date: 05/22/2024  Primary Care Physician: Patient, No Pcp Per Primary Cardiologist Dr. Wilburn  HPI: Brandi Singleton is a 83 y.o. female  with a past medical history of complete heart block status post pacemaker with upgrade to BiV ICD in 2019, nonischemic cardiomyopathy, nonobstructive CAD-heart cath in 2016 with mild CAD, hypertension, NSVT. Last device check 12/2023 with no events. Device longevity 4 months. Echocardiogram 12/2022 with LVEF of 35 to 40%, mild to moderate MR. Presented today for outpatient generator changeout. No complaints.    Past Medical History:  Diagnosis Date   Hyperlipidemia    Hypertension    Thyroid  disease     Past Surgical History:  Procedure Laterality Date   ABDOMINAL HYSTERECTOMY     PACEMAKER INSERTION     TUBAL LIGATION      Medications Prior to Admission  Medication Sig Dispense Refill Last Dose/Taking   aspirin 81 MG chewable tablet Chew 81 mg by mouth daily.   05/21/2024   Aspirin-Acetaminophen-Caffeine (EXCEDRIN EXTRA STRENGTH PO) Take 2 tablets by mouth daily as needed (Headache).   Taking As Needed   atorvastatin  (LIPITOR) 80 MG tablet Take 1 tablet (80 mg total) by mouth at bedtime. 90 tablet 3 05/21/2024   buPROPion (WELLBUTRIN XL) 150 MG 24 hr tablet Take 450 mg by mouth every morning.   05/21/2024   escitalopram  (LEXAPRO ) 20 MG tablet Take 1 tablet (20 mg total) by mouth daily. Due for follow up visit 30 tablet 0 05/21/2024   galantamine (RAZADYNE) 4 MG tablet Take 4 mg by mouth 2 (two) times daily with a meal.   05/21/2024   levothyroxine  (SYNTHROID , LEVOTHROID) 75 MCG tablet Take 1 tablet (75 mcg total) by mouth daily. LAST REFILL.APPOINTMENT REQUIRED FOR REFILLS. 30 tablet 0 05/21/2024   loperamide (IMODIUM) 2 MG capsule Take 2 mg by mouth every Monday, Wednesday, and Friday.   Past Week   memantine  (NAMENDA XR) 28 MG CP24 24 hr capsule Take 28 mg by mouth daily.   05/21/2024   metoprolol  succinate (TOPROL -XL) 100 MG 24 hr tablet Take 100 mg by mouth daily.   05/21/2024   Social History   Socioeconomic History   Marital status: Married    Spouse name: Not on file   Number of children: Not on file   Years of education: Not on file   Highest education level: Not on file  Occupational History   Not on file  Tobacco Use   Smoking status: Never   Smokeless tobacco: Never  Vaping Use   Vaping status: Never Used  Substance and Sexual Activity   Alcohol use: No   Drug use: No   Sexual activity: Not on file  Other Topics Concern   Not on file  Social History Narrative   Not on file   Social Drivers of Health   Financial Resource Strain: Low Risk  (09/12/2023)   Received from [facility]   Overall Financial Resource Strain (CARDIA)    Difficulty of Paying Living Expenses: Not hard at all  Food Insecurity: No Food Insecurity (09/12/2023)   Received from [facility]   Hunger Vital Sign    Within the past 12 months, you worried that your food would run out before you got the money to buy more.: Never true    Within the past 12 months, the  food you bought just didn't last and you didn't have money to get more.: Never true  Transportation Needs: No Transportation Needs (09/12/2023)   Received from [facility] - Transportation    In the past 12 months, has lack of transportation kept you from medical appointments or from getting medications?: No    Lack of Transportation (Non-Medical): No  Physical Activity: Unknown (11/18/2022)   Received from [facility]   Exercise Vital Sign    On average, how many days per week do you engage in moderate to strenuous exercise (like a brisk walk)?: 0 days    Minutes of Exercise per Session: Not on file  Stress: No Stress Concern Present (11/18/2022)   Received from [facility] - Occupational Stress Questionnaire    Feeling of Stress : Only a little  Social Connections: Moderately Integrated (11/18/2022)   Received from [facility]   Social Network    How would you rate your social network (family, work, friends)?: Adequate participation with social networks  Intimate Partner Violence: Not At Risk (11/18/2022)   Received from [facility]   HITS    Over the last 12 months how often did your partner physically hurt you?: Never    Over the last 12 months how often did your partner insult you or talk down to you?: Never    Over the last 12 months how often did your partner threaten you with physical harm?: Never    Over the last 12 months how often did your partner scream or curse at you?: Never    Family History  Problem Relation Age of Onset   Heart failure Mother    Heart failure Father      Review of systems complete and found to be negative unless listed above   Physical Exam: General: Well developed, well nourished, in no acute distress HEENT:  Normocephalic and atramatic Neck:  No JVD.  Lungs: Clear bilaterally to auscultation and percussion. Heart: HRRR . Normal S1 and S2 without gallops or murmurs.  Abdomen: Bowel sounds are positive, abdomen soft and non-tender  Msk:  Back normal, normal gait. Normal strength and tone for age. Extremities: No clubbing, cyanosis or edema.     Labs:  No results found for: WBC, HGB, HCT, MCV, PLT No results for input(s): NA, K, CL, CO2, BUN, CREATININE, CALCIUM , PROT, BILITOT, ALKPHOS, ALT, AST, GLUCOSE in the last 168 hours.  Invalid input(s): LABALBU No results found for: CKTOTAL, CKMB, CKMBINDEX, TROPONINI  Lab Results  Component Value Date   CHOL 157 10/15/2016   CHOL 149 10/06/2015   CHOL 303 (H) 12/17/2014   Lab Results  Component Value Date   HDL 38 (L) 10/15/2016   HDL 38 (L) 10/06/2015   HDL 32 (L) 12/17/2014    Lab Results  Component Value Date   LDLCALC 88 10/06/2015   LDLCALC 213 (H) 12/17/2014   LDLCALC 103 (H) 07/17/2013   Lab Results  Component Value Date   TRIG 155 (H) 10/15/2016   TRIG 113 10/06/2015   TRIG 291 (H) 12/17/2014   Lab Results  Component Value Date   CHOLHDL 4.1 10/15/2016   CHOLHDL 3.9 10/06/2015   CHOLHDL 9.5 12/17/2014   No results found for: LDLDIRECT   EKG: Office EKG from last visit reviewed.   ASSESSMENT AND PLAN:  Brandi Singleton is a 83 y.o. female  with a past medical history of complete heart block  status post pacemaker with upgrade to BiV ICD in 2019, nonischemic cardiomyopathy, nonobstructive CAD-heart cath in 2016 with mild CAD, hypertension, NSVT. Presents today for outpatient ICD generator changeout.  Proceed with gen change as scheduled.   This patient's plan of care was discussed and created with Dr. Ammon and he is in agreement.    SignedBETHA Danita Bloch, PA-C  05/22/2024, 9:26 AM

## 2024-05-22 NOTE — Discharge Instructions (Signed)
 Patient may shower 05/24/2024.  Patient may remove outer bandage after shower, leave Steri-Strips on.

## 2024-05-29 DIAGNOSIS — I442 Atrioventricular block, complete: Secondary | ICD-10-CM | POA: Diagnosis not present

## 2024-05-29 DIAGNOSIS — Z950 Presence of cardiac pacemaker: Secondary | ICD-10-CM | POA: Diagnosis not present
